# Patient Record
Sex: Female | Born: 1950 | Race: White | Hispanic: No | Marital: Married | Smoking: Never smoker
Health system: Southern US, Community
[De-identification: ages and names within clinical notes are randomized; demographics above are authoritative.]

## PROBLEM LIST (undated history)

## (undated) DIAGNOSIS — M5126 Other intervertebral disc displacement, lumbar region: Secondary | ICD-10-CM

## (undated) DIAGNOSIS — E669 Obesity, unspecified: Secondary | ICD-10-CM

## (undated) HISTORY — PX: DILATION AND CURETTAGE OF UTERUS: SHX78

## (undated) HISTORY — PX: KNEE SURGERY: SHX244

## (undated) HISTORY — PX: TUBAL LIGATION: SHX77

---

## 2019-11-11 ENCOUNTER — Inpatient Hospital Stay (HOSPITAL_COMMUNITY): Payer: BC Managed Care – PPO

## 2019-11-11 ENCOUNTER — Emergency Department (HOSPITAL_COMMUNITY): Payer: BC Managed Care – PPO

## 2019-11-11 ENCOUNTER — Encounter (HOSPITAL_COMMUNITY): Payer: Self-pay | Admitting: Emergency Medicine

## 2019-11-11 ENCOUNTER — Encounter (HOSPITAL_COMMUNITY): Payer: BC Managed Care – PPO

## 2019-11-11 ENCOUNTER — Other Ambulatory Visit: Payer: Self-pay

## 2019-11-11 ENCOUNTER — Observation Stay (HOSPITAL_COMMUNITY)
Admission: EM | Admit: 2019-11-11 | Discharge: 2019-11-12 | DRG: 065 | Disposition: A | Discharge status: Home / Self Care | Payer: BC Managed Care – PPO | Attending: Internal Medicine | Admitting: Internal Medicine

## 2019-11-11 DIAGNOSIS — I63411 Cerebral infarction due to embolism of right middle cerebral artery: Principal | ICD-10-CM | POA: Diagnosis present

## 2019-11-11 DIAGNOSIS — G8194 Hemiplegia, unspecified affecting left nondominant side: Secondary | ICD-10-CM | POA: Diagnosis not present

## 2019-11-11 DIAGNOSIS — R531 Weakness: Secondary | ICD-10-CM | POA: Diagnosis present

## 2019-11-11 DIAGNOSIS — E669 Obesity, unspecified: Secondary | ICD-10-CM | POA: Diagnosis not present

## 2019-11-11 DIAGNOSIS — I6389 Other cerebral infarction: Secondary | ICD-10-CM

## 2019-11-11 DIAGNOSIS — Z8051 Family history of malignant neoplasm of kidney: Secondary | ICD-10-CM | POA: Diagnosis not present

## 2019-11-11 DIAGNOSIS — I10 Essential (primary) hypertension: Secondary | ICD-10-CM | POA: Diagnosis not present

## 2019-11-11 DIAGNOSIS — Z91040 Latex allergy status: Secondary | ICD-10-CM | POA: Diagnosis not present

## 2019-11-11 DIAGNOSIS — R29701 NIHSS score 1: Secondary | ICD-10-CM | POA: Diagnosis present

## 2019-11-11 DIAGNOSIS — Z803 Family history of malignant neoplasm of breast: Secondary | ICD-10-CM | POA: Diagnosis not present

## 2019-11-11 DIAGNOSIS — Z6835 Body mass index (BMI) 35.0-35.9, adult: Secondary | ICD-10-CM | POA: Diagnosis not present

## 2019-11-11 DIAGNOSIS — I672 Cerebral atherosclerosis: Secondary | ICD-10-CM | POA: Diagnosis not present

## 2019-11-11 DIAGNOSIS — E785 Hyperlipidemia, unspecified: Secondary | ICD-10-CM | POA: Diagnosis present

## 2019-11-11 DIAGNOSIS — R2990 Unspecified symptoms and signs involving the nervous system: Secondary | ICD-10-CM | POA: Diagnosis not present

## 2019-11-11 DIAGNOSIS — R739 Hyperglycemia, unspecified: Secondary | ICD-10-CM | POA: Diagnosis present

## 2019-11-11 DIAGNOSIS — Z20822 Contact with and (suspected) exposure to covid-19: Secondary | ICD-10-CM | POA: Diagnosis present

## 2019-11-11 DIAGNOSIS — Z880 Allergy status to penicillin: Secondary | ICD-10-CM | POA: Diagnosis not present

## 2019-11-11 DIAGNOSIS — I639 Cerebral infarction, unspecified: Secondary | ICD-10-CM | POA: Diagnosis not present

## 2019-11-11 HISTORY — DX: Cerebral infarction, unspecified: I63.9

## 2019-11-11 HISTORY — DX: Obesity, unspecified: E66.9

## 2019-11-11 HISTORY — DX: Other intervertebral disc displacement, lumbar region: M51.26

## 2019-11-11 LAB — URINALYSIS, ROUTINE W REFLEX MICROSCOPIC
Bilirubin Urine: NEGATIVE
Glucose, UA: NEGATIVE mg/dL
Hgb urine dipstick: NEGATIVE
Ketones, ur: NEGATIVE mg/dL
Nitrite: NEGATIVE
Protein, ur: NEGATIVE mg/dL
Specific Gravity, Urine: 1.003 — ABNORMAL LOW (ref 1.005–1.030)
pH: 6 (ref 5.0–8.0)

## 2019-11-11 LAB — COMPREHENSIVE METABOLIC PANEL
ALT: 20 U/L (ref 0–44)
AST: 19 U/L (ref 15–41)
Albumin: 4.2 g/dL (ref 3.5–5.0)
Alkaline Phosphatase: 97 U/L (ref 38–126)
Anion gap: 12 (ref 5–15)
BUN: 12 mg/dL (ref 8–23)
CO2: 24 mmol/L (ref 22–32)
Calcium: 9.6 mg/dL (ref 8.9–10.3)
Chloride: 103 mmol/L (ref 98–111)
Creatinine, Ser: 0.85 mg/dL (ref 0.44–1.00)
GFR calc Af Amer: >60 mL/min (ref >60)
GFR calc non Af Amer: >60 mL/min (ref >60)
Glucose, Bld: 142 mg/dL — ABNORMAL HIGH (ref 70–99)
Potassium: 4 mmol/L (ref 3.5–5.1)
Sodium: 139 mmol/L (ref 135–145)
Total Bilirubin: 0.2 mg/dL — ABNORMAL LOW (ref 0.3–1.2)
Total Protein: 6.5 g/dL (ref 6.5–8.1)

## 2019-11-11 LAB — DIFFERENTIAL
Abs Immature Granulocytes: 0.04 10*3/uL (ref 0.00–0.07)
Basophils Absolute: 0.1 10*3/uL (ref 0.0–0.1)
Basophils Relative: 1 %
Eosinophils Absolute: 0.1 10*3/uL (ref 0.0–0.5)
Eosinophils Relative: 1 %
Immature Granulocytes: 0 %
Lymphocytes Relative: 17 %
Lymphs Abs: 1.6 10*3/uL (ref 0.7–4.0)
Monocytes Absolute: 0.5 10*3/uL (ref 0.1–1.0)
Monocytes Relative: 5 %
Neutro Abs: 7.1 10*3/uL (ref 1.7–7.7)
Neutrophils Relative %: 76 %

## 2019-11-11 LAB — RAPID URINE DRUG SCREEN, HOSP PERFORMED
Amphetamines: NOT DETECTED
Barbiturates: NOT DETECTED
Benzodiazepines: NOT DETECTED
Cocaine: NOT DETECTED
Opiates: NOT DETECTED
Tetrahydrocannabinol: NOT DETECTED

## 2019-11-11 LAB — CBC
HCT: 47.2 % — ABNORMAL HIGH (ref 36.0–46.0)
Hemoglobin: 15.7 g/dL — ABNORMAL HIGH (ref 12.0–15.0)
MCH: 30.3 pg (ref 26.0–34.0)
MCHC: 33.3 g/dL (ref 30.0–36.0)
MCV: 91.1 fL (ref 80.0–100.0)
Platelets: 280 10*3/uL (ref 150–400)
RBC: 5.18 MIL/uL — ABNORMAL HIGH (ref 3.87–5.11)
RDW: 12.8 % (ref 11.5–15.5)
WBC: 9.5 10*3/uL (ref 4.0–10.5)
nRBC: 0 % (ref 0.0–0.2)

## 2019-11-11 LAB — PROTIME-INR
INR: 0.9 (ref 0.8–1.2)
Prothrombin Time: 12.1 seconds (ref 11.4–15.2)

## 2019-11-11 LAB — I-STAT CHEM 8, ED
BUN: 13 mg/dL (ref 8–23)
Calcium, Ion: 1.15 mmol/L (ref 1.15–1.40)
Chloride: 105 mmol/L (ref 98–111)
Creatinine, Ser: 0.7 mg/dL (ref 0.44–1.00)
Glucose, Bld: 145 mg/dL — ABNORMAL HIGH (ref 70–99)
HCT: 46 % (ref 36.0–46.0)
Hemoglobin: 15.6 g/dL — ABNORMAL HIGH (ref 12.0–15.0)
Potassium: 4 mmol/L (ref 3.5–5.1)
Sodium: 141 mmol/L (ref 135–145)
TCO2: 27 mmol/L (ref 22–32)

## 2019-11-11 LAB — ECHOCARDIOGRAM COMPLETE
Height: 64 in
Weight: 3328 oz

## 2019-11-11 LAB — HEMOGLOBIN A1C
Hgb A1c MFr Bld: 5.4 % (ref 4.8–5.6)
Mean Plasma Glucose: 108.28 mg/dL

## 2019-11-11 LAB — SARS CORONAVIRUS 2 (TAT 6-24 HRS): SARS Coronavirus 2: NEGATIVE

## 2019-11-11 LAB — APTT: aPTT: 29 seconds (ref 24–36)

## 2019-11-11 LAB — ETHANOL: Alcohol, Ethyl (B): <10 mg/dL (ref <10)

## 2019-11-11 LAB — HIV ANTIBODY (ROUTINE TESTING W REFLEX): HIV Screen 4th Generation wRfx: NONREACTIVE

## 2019-11-11 MED ORDER — CLOPIDOGREL BISULFATE 75 MG PO TABS
75.0000 mg | ORAL_TABLET | Freq: Every day | ORAL | Status: DC
  Administered 2019-11-12: 75 mg via ORAL
  Filled 2019-11-11 (×2): qty 1

## 2019-11-11 MED ORDER — ACETAMINOPHEN 325 MG PO TABS: 650.0000 mg | ORAL_TABLET | ORAL | Status: DC | PRN

## 2019-11-11 MED ORDER — ASPIRIN 325 MG PO TABS: 325.0000 mg | ORAL_TABLET | Freq: Every day | ORAL | Status: DC

## 2019-11-11 MED ORDER — CLOPIDOGREL BISULFATE 300 MG PO TABS
300.0000 mg | ORAL_TABLET | Freq: Once | ORAL | Status: AC
  Administered 2019-11-11: 300 mg via ORAL
  Filled 2019-11-11: qty 1

## 2019-11-11 MED ORDER — ENOXAPARIN SODIUM 40 MG/0.4ML ~~LOC~~ SOLN
40.0000 mg | SUBCUTANEOUS | Status: DC
  Administered 2019-11-11: 40 mg via SUBCUTANEOUS
  Filled 2019-11-11 (×2): qty 0.4

## 2019-11-11 MED ORDER — SODIUM CHLORIDE 0.9 % IV SOLN: INTRAVENOUS | Status: DC

## 2019-11-11 MED ORDER — ASPIRIN 300 MG RE SUPP: 300.0000 mg | Freq: Every day | RECTAL | Status: DC

## 2019-11-11 MED ORDER — ACETAMINOPHEN 160 MG/5ML PO SOLN: 650.0000 mg | ORAL | Status: DC | PRN

## 2019-11-11 MED ORDER — ACETAMINOPHEN 650 MG RE SUPP: 650.0000 mg | RECTAL | Status: DC | PRN

## 2019-11-11 MED ORDER — ATORVASTATIN CALCIUM 80 MG PO TABS
80.0000 mg | ORAL_TABLET | Freq: Every day | ORAL | Status: DC
  Filled 2019-11-11: qty 1

## 2019-11-11 MED ORDER — STROKE: EARLY STAGES OF RECOVERY BOOK
Freq: Once | Status: AC
  Filled 2019-11-11: qty 1

## 2019-11-11 MED ORDER — ASPIRIN EC 81 MG PO TBEC
81.0000 mg | DELAYED_RELEASE_TABLET | Freq: Every day | ORAL | Status: DC
  Administered 2019-11-11 – 2019-11-12 (×2): 81 mg via ORAL
  Filled 2019-11-11 (×2): qty 1

## 2019-11-11 MED ORDER — SENNOSIDES-DOCUSATE SODIUM 8.6-50 MG PO TABS: 1.0000 | ORAL_TABLET | Freq: Every evening | ORAL | Status: DC | PRN

## 2019-11-11 MED ORDER — IOHEXOL 350 MG/ML SOLN
75.0000 mL | Freq: Once | INTRAVENOUS | Status: AC | PRN
  Administered 2019-11-11: 75 mL via INTRAVENOUS

## 2019-11-11 NOTE — Progress Notes (Signed)
Patient admitted to [facility] 18 with left side weakness due to acute CVA . Pt alert oriented x 4 VS WNL. She is in locked at lower level. Bedside table call light and telephone at bedside. Will continue to monitor patient.

## 2019-11-11 NOTE — Progress Notes (Signed)
Pt transferred to Green zone. Pt is AOx4, pt talkative and reports no needs at this time. Call light placed within reach.

## 2019-11-11 NOTE — Progress Notes (Signed)
Lower extremity venous has been completed.   Preliminary results in CV Proc.   Blanch Media 11/11/2019 1:31 PM

## 2019-11-11 NOTE — Progress Notes (Addendum)
Pt vts are stable, pt talking on phone, pt reporting no needs at this time. Pt taken to bathroom, able to walk across hall to restroom. Pt walked back to room. Call light placed within reach.

## 2019-11-11 NOTE — ED Triage Notes (Signed)
At approx 0230 pt awoke feeling heavy and "weird" in her left arm and leg. Pt able to walk unassisted but has minor limp which is abnormal. NIH 0 at this time. Pt reports symptoms improving since waking up. LSN 2330.

## 2019-11-11 NOTE — Plan of Care (Signed)
  Problem: Education: Goal: Knowledge of disease or condition will improve Outcome: Progressing Goal: Knowledge of patient specific risk factors addressed and post discharge goals established will improve Outcome: Progressing   Problem: Coping: Goal: Will verbalize positive feelings about self Outcome: Progressing Goal: Will identify appropriate support needs Outcome: Progressing   Problem: Self-Care: Goal: Ability to participate in self-care as condition permits will improve Outcome: Progressing

## 2019-11-11 NOTE — Consult Note (Signed)
Neurology Consultation  Reason for Consult: Code stroke-left-sided weakness Referring Physician: Dr. Eudelia Bunch  CC: Left-sided numbness and weakness  History is obtained from: Patient  HPI: Nancy Travis is a 69 y.o. female with no past medical history, last seen normal at 11:30 PM on 11/10/2019, brought in for evaluation of left-sided heaviness.  She says that she went to bed normal at 11:30 PM on 11/10/2019.  She woke up around 2:30 AM feeling somewhat heavy on the left side.  She describes the feeling as her left arm and leg not obeying her commands.  She tried to get up but felt that she could not walk and her body cannot function on that r left side as well it did on the right side.  She lives in [place], has a hospital near the house, but knows that [facility] is a big stroke center.  She asked her husband to drive her to [facility].  She was evaluated here in the emergency room, NIH stroke scale was 0. Neurological consultation was obtained for ongoing subtle left-sided weakness of sudden onset.  LKW: 11:30 PM on 11/09/2018 tpa given?: no, mild symptoms, resolving symptoms,, outside the window Premorbid modified Rankin scale (mRS): 0  ROS: Review of systems performed and negative except noted in the HPI  History reviewed. No pertinent past medical history. Past medical history: None   No family history on file. No family history of stroke  Social History:   reports that she has never smoked. She has never used smokeless tobacco. She reports that she does not drink alcohol or use drugs. Never smoker.  No illicit drugs. 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  Does not see a doctor regularly.  Medications No current facility-administered medications for this encounter. No current outpatient medications on file.  Exam: Current vital signs: BP (!) 155/103   Pulse 90   Temp 98.6 F (37 C) (Oral)   Resp 16   Ht 5\' 4"  (1.626 m)   Wt 94.3 kg    SpO2 97%   BMI 35.70 kg/m  Vital signs in last 24 hours: Temp:  [98.6 F (37 C)] 98.6 F (37 C) (01/15 0414) Pulse Rate:  [90] 90 (01/15 0346) Resp:  [16] 16 (01/15 0346) BP: (155)/(103) 155/103 (01/15 0346) SpO2:  [97 %] 97 % (01/15 0346) Weight:  [94.3 kg] 94.3 kg (01/15 0344) GENERAL: Awake, alert in NAD HEENT: - Normocephalic and atraumatic, dry mm, no LN++, no Thyromegally LUNGS - Clear to auscultation bilaterally with no wheezes CV - S1S2 RRR, no m/r/g, equal pulses bilaterally. ABDOMEN - Soft, nontender, nondistended with normoactive BS Ext: warm, well perfused, intact peripheral pulses, no edema  NEURO:  Mental Status: AA&Ox3  Language: speech is nondysarthric.  Naming, repetition, fluency, and comprehension intact. Cranial Nerves: PERRL EOMI, visual fields full, no facial asymmetry, facial sensation intact, hearing intact, tongue/uvula/soft palate midline, normal sternocleidomastoid and trapezius muscle strength. No evidence of tongue atrophy or fibrillations Motor: Left upper 4+/5 and left lower extremity are 4/5 in comparison to the right side which is 5/5 upper and lower extremity.  There is only vertical drift on the left leg and no vertical drift on the left upper extremity Tone: is normal and bulk is normal normal Sensation- Intact to light touch bilaterally, no extinction Coordination: Mild dysmetria on left upper and lower extremity-not disproportionate to the weakness.  No dysmetria on the right Gait- deferred  NIHSS-1 left leg drift   Labs I  have reviewed labs in epic and the results pertinent to this consultation are:   CBC    Component Value Date/Time   HGB 15.6 (H) 11/11/2019 0437   HCT 46.0 11/11/2019 0437    CMP     Component Value Date/Time   NA 141 11/11/2019 0437   K 4.0 11/11/2019 0437   CL 105 11/11/2019 0437   GLUCOSE 145 (H) 11/11/2019 0437   BUN 13 11/11/2019 0437   CREATININE 0.70 11/11/2019 0437   Imaging I have reviewed the  images obtained:  CT-scan of the brain-no acute changes   Assessment: 69 year old with no past medical history with sudden onset of left-sided numbness and weakness, which is improving since she noted it at 2:30 AM. Last known normal 11:30 PM last night when she went to bed. No TPA due to being outside the window at the time of neurological evaluation as well as minor symptoms and improving symptoms. Exam not suggestive of LVO.  Emergent vessel imaging not performed. She does however have subtle weakness of the left upper and lower extremity and I suspect has had a small vessel stroke. She would benefit from admission for risk factor work-up.   Impression:  Evaluate for ischemic stroke of small vessel etiology.  Recommendations: MRI brain without contrast MRA head without contrast Carotid Dopplers Admit to hospitalist Frequent neurochecks Telemetry 2D echo A1c Lipid panel Aspirin 325 Atorvastatin 80 PT OT Speech therapy N.p.o. until cleared by bedside swallow or formal swallow evaluation  Stroke team to continue to follow with you  -- Amie Portland, MD [facility] Pager: [phone] If 7pm to 7am, please call on call as listed on AMION.

## 2019-11-11 NOTE — Congregational Nurse Program (Signed)
Responded to Code Stroke called at 0429 on pt already in ED. Pt brought to ED by husband d/t L sided weakness upon waking from sleep. Pt [id number]. Code stroke was initiated upon MD assessment in ED.  On my assessment on pt in CT, NIH-1 for LLE drift. CT head negative for acute changes. Pt out of window for TPA. LVO negative.  Plan MRI.

## 2019-11-11 NOTE — Evaluation (Addendum)
Physical Therapy Evaluation Patient Details Name: Nancy Travis MRN: [id number] DOB: January 24, 1951 Today's Date: 11/11/2019   History of Present Illness  Nancy Travis is a 69 y.o. female with no past medical history, last seen normal at 11:30 PM on 11/10/2019, brought in for evaluation of left-sided heaviness. MRI showing cluster of small acute infarcts in a right MCA branch  Clinical Impression  Pt admitted with above. Pt presents with left sided weakness and static/dynamic balance impairments. Ambulating 100 feet with handheld assist, needs external support for stability. Overall, pt with good awareness of deficits and symptoms of a stroke. Prior to admission, pt is independent and lives with her husband in an apartment with an Engineer, structural. Recommended use of walker for all mobility. Will benefit from intensive OPPT to promote neuro recovery and maximize functional independence.    Follow Up Recommendations Outpatient PT    Equipment Recommendations  Rolling walker with 5" wheels    Recommendations for Other Services       Precautions / Restrictions Precautions Precautions: Fall Restrictions Weight Bearing Restrictions: No      Mobility  Bed Mobility Overal bed mobility: Modified Independent                Transfers Overall transfer level: Needs assistance Equipment used: None Transfers: Sit to/from Stand Sit to Stand: Supervision            Ambulation/Gait Ambulation/Gait assistance: Min assist Gait Distance (Feet): 100 Feet Assistive device: 1 person hand held assist Gait Pattern/deviations: Step-through pattern;Decreased stance time - left;Decreased dorsiflexion - left;Decreased weight shift to left Gait velocity: decreased Gait velocity interpretation: <1.8 ft/sec, indicate of risk for recurrent falls General Gait Details: MinA for static/dynamic stability. Pt with decreased LLE weightbearing, with heavy reliance through RLE and moderate reliance  through handheld assist.  Stairs            Wheelchair Mobility    Modified Rankin (Stroke Patients Only) Modified Rankin (Stroke Patients Only) Pre-Morbid Rankin Score: No symptoms Modified Rankin: Moderately severe disability     Balance Overall balance assessment: Needs assistance Sitting-balance support: Feet supported Sitting balance-Leahy Scale: Normal     Standing balance support: No upper extremity supported;During functional activity Standing balance-Leahy Scale: Good           Rhomberg - Eyes Opened: 10(increased lateral sway)                   Pertinent Vitals/Pain Pain Assessment: Faces Faces Pain Scale: No hurt    Home Living Family/patient expects to be discharged to:: Private residence Living Arrangements: Spouse/significant other Available Help at Discharge: Family Type of Home: Apartment Home Access: Elevator     Home Layout: One level Home Equipment: Environmental consultant - 2 wheels;Cane - single point      Prior Function Level of Independence: Independent               Hand Dominance        Extremity/Trunk Assessment   Upper Extremity Assessment Upper Extremity Assessment: RUE deficits/detail;LUE deficits/detail RUE Deficits / Details: Strength 5/5 LUE Deficits / Details: Grossly 4/5, weak hand grip    Lower Extremity Assessment Lower Extremity Assessment: RLE deficits/detail;LLE deficits/detail RLE Deficits / Details: Strength 5/5 LLE Deficits / Details: Hip flexion 3+/5, knee extension 4-/5, ankle dorsiflexion 5/5    Cervical / Trunk Assessment Cervical / Trunk Assessment: Normal  Communication   Communication: No difficulties  Cognition Arousal/Alertness: Awake/alert Behavior During Therapy: WFL for tasks assessed/performed Overall Cognitive  Status: Within Functional Limits for tasks assessed                                 General Comments: Very good awareness of deficits and stroke etiology       General Comments      Exercises     Assessment/Plan    PT Assessment Patient needs continued PT services  PT Problem List Decreased strength;Decreased activity tolerance;Decreased balance;Decreased mobility       PT Treatment Interventions DME instruction;Gait training;Stair training;Functional mobility training;Therapeutic activities;Therapeutic exercise;Balance training;Patient/family education    PT Goals (Current goals can be found in the Care Plan section)  Acute Rehab PT Goals Patient Stated Goal: "get back to normal." PT Goal Formulation: With patient Time For Goal Achievement: 11/25/19 Potential to Achieve Goals: Good    Frequency Min 4X/week   Barriers to discharge        Co-evaluation               AM-PAC PT "6 Clicks" Mobility  Outcome Measure Help needed turning from your back to your side while in a flat bed without using bedrails?: None Help needed moving from lying on your back to sitting on the side of a flat bed without using bedrails?: None Help needed moving to and from a bed to a chair (including a wheelchair)?: A Little Help needed standing up from a chair using your arms (e.g., wheelchair or bedside chair)?: None Help needed to walk in hospital room?: A Little Help needed climbing 3-5 steps with a railing? : A Little 6 Click Score: 21    End of Session Equipment Utilized During Treatment: Gait belt Activity Tolerance: Patient tolerated treatment well Patient left: in bed;with call bell/phone within reach;with bed alarm set;with family/visitor present Nurse Communication: Mobility status PT Visit Diagnosis: Unsteadiness on feet (R26.81);Other abnormalities of gait and mobility (R26.89);Other symptoms and signs involving the nervous system (R29.898)    Time: 1610-9604 PT Time Calculation (min) (ACUTE ONLY): 21 min   Charges:   PT Evaluation $PT Eval Moderate Complexity: 1 Mod          Ellamae Sia, [place], DPT Acute Rehabilitation  Services Pager [phone] Office [phone]   Willy Eddy 11/11/2019, 5:27 PM

## 2019-11-11 NOTE — H&P (Signed)
History and Physical    Nancy Latinlizabeth P Risser BMW:[id number] DOB: 1951-02-06 DOA: 11/11/2019  PCP: Patient, No Pcp Per - last saw a doctor 3-4 years ago Consultants:  None Patient coming from:  Home - lives with husband; NOK: Dondra SpryHusband, Edward Winkowski, [phone]  Chief Complaint: left-sided weakness  HPI: Nancy Travis is a [age] y.o. female with no significant medical history presenting with left-sided weakness. She watched the late news last night and went to bed about 1130 pm and went to sleep.  Sometime thereafter, she woke up and was unable to turn over.  Her body would just not work.  She couldn't get her left arm to work at all, felt like a dead weight.  She managed to get up out of bed and placed her weight on her left leg and it wasn't able to hold her up.  Her husband woke up and they were concerned that this was a stroke.  She is still able to lift the arm but it will not function - although it is much better than it was.  She felt a slight tingling in her left lower face en route but that quickly resolved.  Her leg is still weak.  No dysphagia or dysarthria.     ED Course:  Carryover, per Dr. Knute NeuKarrakandy:  69 year old female presents with sudden onset of left-sided weakness when she woke up from the sleep early this morning around 2 AM. Neurology has been consulted felt that patient was not a candidate for TPA. Covid test is pending.  Review of Systems: As per HPI; otherwise review of systems reviewed and negative.   Ambulatory Status:  Ambulates without assistance  Past Medical History:  Diagnosis Date  . CVA (cerebral vascular accident) (HCC) 11/11/2019  . Lumbar herniated disc   . Obesity (BMI 35.0-39.9 without comorbidity)     Past Surgical History:  Procedure Laterality Date  . DILATION AND CURETTAGE OF UTERUS    . KNEE SURGERY    . TUBAL LIGATION      Social History   Socioeconomic History  . Marital status: Married    Spouse name: Not on file  . Number of  children: Not on file  . Years of education: Not on file  . Highest education level: Not on file  Occupational History  . 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 Smoking status: Never Smoker  . Smokeless tobacco: Never Used  Substance and Sexual Activity  . Alcohol use: Never  . Drug use: Never  . Sexual activity: Not Currently  Other Topics Concern  . Not on file  Social History Narrative  . Not on file   Social Determinants of Health   Financial Resource Strain:   . Difficulty of Paying Living Expenses: Not on file  Food Insecurity:   . Worried About Programme researcher, broadcasting/film/videounning Out of Food in the Last Year: Not on file  . Ran Out of Food in the Last Year: Not on file  Transportation Needs:   . Lack of Transportation (Medical): Not on file  . Lack of Transportation (Non-Medical): Not on file  Physical Activity:   . Days of Exercise per Week: Not on file  . Minutes of Exercise per Session: Not on file  Stress:   . Feeling of Stress : Not on file  Social Connections:   . Frequency of Communication with Friends and Family: Not on file  . Frequency of Social Gatherings with Friends and Family: Not on file  . Attends  Religious Services: Not on file  . Active Member of Clubs or Organizations: Not on file  . Attends Archivist Meetings: Not on file  . Marital Status: Not on file  Intimate Partner Violence:   . Fear of Current or Ex-Partner: Not on file  . Emotionally Abused: Not on file  . Physically Abused: Not on file  . Sexually Abused: Not on file    Allergies  Allergen Reactions  . Penicillins Anaphylaxis and Hives    Did it involve swelling of the face/tongue/throat, SOB, or low BP? yes Did it involve sudden or severe rash/hives, skin peeling, or any reaction on the inside of your mouth or nose? Yes Did you need to seek medical attention at a hospital or doctor's office? yes When did it last happen?in her 68s If all above answers are "NO", may  proceed with cephalosporin use.   . Levaquin [Levofloxacin] Nausea Only  . Sulfa Antibiotics Nausea Only  . Latex Rash    Family History  Problem Relation Age of Onset  . Diverticulitis Mother   . Breast cancer Mother   . Dementia Father   . Kidney cancer Brother   . Stroke Neg Hx     Prior to Admission medications   Medication Sig Start Date End Date Taking? Authorizing Provider  acetaminophen (TYLENOL) 500 MG tablet Take 500 mg by mouth every 6 (six) hours as needed.   Yes [provider]  naproxen sodium (ALEVE) 220 MG tablet Take 440 mg by mouth daily as needed (pain).   Yes [provider]    Physical Exam: Vitals:   11/11/19 1200 11/11/19 1215 11/11/19 1230 11/11/19 1314  BP: (!) 152/98 (!) 126/96 (!) 146/86 (!) 152/96  Pulse: 92 81 84 78  Resp: 20 15 16 18   Temp:      TempSrc:    Oral  SpO2: 95% 95% 95% 98%  Weight:      Height:         . General:  Appears calm and comfortable and is NAD; extremely conversant . Eyes:  PERRL, EOMI, normal lids, iris . ENT:  grossly normal hearing, lips & tongue, mmm; appropriate dentition . Neck:  no LAD, masses or thyromegaly; no carotid bruits . Cardiovascular:  RRR, no m/r/g. No LE edema.  Marland Kitchen Respiratory:   CTA bilaterally with no wheezes/rales/rhonchi.  Normal respiratory effort. . Abdomen:  soft, NT, ND, NABS . Skin:  B LE stasis dermatitis in square patches on the anterior lower legs . Musculoskeletal:  3-4/5 left-sided strength with arm > leg . Psychiatric:  grossly normal mood and affect, speech fluent and appropriate, AOx3 . Neurologic:  CN 2-12 grossly intact, moves all extremities in coordinated fashion    Radiological Exams on Admission: CT ANGIO HEAD W OR WO CONTRAST  Result Date: 11/11/2019 CLINICAL DATA:  Stroke, follow-up. Additional history provided: Sudden onset left-sided numbness and weakness, which is improving since 2:30 a.m. EXAM: CT ANGIOGRAPHY HEAD AND NECK TECHNIQUE: Multidetector  CT imaging of the head and neck was performed using the standard protocol during bolus administration of intravenous contrast. Multiplanar CT image reconstructions and MIPs were obtained to evaluate the vascular anatomy. Carotid stenosis measurements (when applicable) are obtained utilizing NASCET criteria, using the distal internal carotid diameter as the denominator. CONTRAST:  38mL OMNIPAQUE IOHEXOL 350 MG/ML SOLN COMPARISON:  Noncontrast head CT 11/11/2019 FINDINGS: CTA NECK FINDINGS Aortic arch: Standard aortic branching. No significant atherosclerotic disease within the visualized aortic arch. Right carotid system: CCA  and ICA patent within the neck without stenosis. No significant atherosclerotic disease. Left carotid system: CCA and ICA patent within the neck without stenosis. No significant atherosclerotic disease. Vertebral arteries: Codominant. Patent within the neck without stenosis. Skeleton: Cervical spondylosis. This includes somewhat prominent posterior disc osteophytes at the C4-C5 and C5-C6 levels, contributing to at least mild/moderate spinal canal stenosis. Age-indeterminate although presumed chronic T4 superior endplate compression fracture. T2 vertebral body hemangioma. Other neck: No neck mass or cervical lymphadenopathy. Upper chest: No consolidation within the imaged lung apices. Review of the MIP images confirms the above findings CTA HEAD FINDINGS Anterior circulation: The intracranial internal carotid arteries are patent without stenosis. Minimal calcified plaque within these vessels bilaterally. The M1 right middle cerebral artery is patent without stenosis. No M2 proximal branch occlusion or high-grade proximal stenosis. The high-grade right M2/M3 branch stenosis demonstrated on MRA performed earlier the same day is not well appreciated on the current study. The right anterior cerebral artery is patent without significant proximal stenosis. The M1 left middle cerebral artery is patent  without significant stenosis. No M2 proximal branch occlusion or high-grade proximal stenosis is identified. Atherosclerotic irregularity of a left M3/M4 branche was better appreciated on prior MRA. The left anterior cerebral artery is patent without significant proximal stenosis. No intracranial aneurysm is identified Posterior circulation: The intracranial vertebral arteries are patent without significant stenosis, as is the basilar artery. Small fenestration within the proximal basilar artery redemonstrated. There are sizable bilateral posterior communicating arteries which are patent. The posterior cerebral arteries are patent bilaterally without significant proximal stenosis. Venous sinuses: Within limitations of contrast timing, no convincing thrombus. Anatomic variants: None significant Review of the MIP images confirms the above findings IMPRESSION: CTA neck: 1. The common carotid, internal carotid and vertebral arteries are patent within the neck without significant stenosis. 2. T4 superior endplate compression deformity, age-indeterminate although presumed chronic. Clinical correlation is recommended. CTA head: 1. No intracranial large vessel occlusion. 2. No proximal M2 branch occlusion or high-grade stenosis identified. 3. A high-grade right M2/M3 branch short-segment narrowing was better appreciated on prior MRA. 4. Atherosclerotic irregularity of a left M3/M4 MCA branch was also better appreciated on prior MRA. Electronically Signed   By: Jackey Loge DO   On: 11/11/2019 10:30   CT ANGIO NECK W OR WO CONTRAST  Result Date: 11/11/2019 CLINICAL DATA:  Stroke, follow-up. Additional history provided: Sudden onset left-sided numbness and weakness, which is improving since 2:30 a.m. EXAM: CT ANGIOGRAPHY HEAD AND NECK TECHNIQUE: Multidetector CT imaging of the head and neck was performed using the standard protocol during bolus administration of intravenous contrast. Multiplanar CT image reconstructions  and MIPs were obtained to evaluate the vascular anatomy. Carotid stenosis measurements (when applicable) are obtained utilizing NASCET criteria, using the distal internal carotid diameter as the denominator. CONTRAST:  67mL OMNIPAQUE IOHEXOL 350 MG/ML SOLN COMPARISON:  Noncontrast head CT 11/11/2019 FINDINGS: CTA NECK FINDINGS Aortic arch: Standard aortic branching. No significant atherosclerotic disease within the visualized aortic arch. Right carotid system: CCA and ICA patent within the neck without stenosis. No significant atherosclerotic disease. Left carotid system: CCA and ICA patent within the neck without stenosis. No significant atherosclerotic disease. Vertebral arteries: Codominant. Patent within the neck without stenosis. Skeleton: Cervical spondylosis. This includes somewhat prominent posterior disc osteophytes at the C4-C5 and C5-C6 levels, contributing to at least mild/moderate spinal canal stenosis. Age-indeterminate although presumed chronic T4 superior endplate compression fracture. T2 vertebral body hemangioma. Other neck: No neck mass or cervical lymphadenopathy.  Upper chest: No consolidation within the imaged lung apices. Review of the MIP images confirms the above findings CTA HEAD FINDINGS Anterior circulation: The intracranial internal carotid arteries are patent without stenosis. Minimal calcified plaque within these vessels bilaterally. The M1 right middle cerebral artery is patent without stenosis. No M2 proximal branch occlusion or high-grade proximal stenosis. The high-grade right M2/M3 branch stenosis demonstrated on MRA performed earlier the same day is not well appreciated on the current study. The right anterior cerebral artery is patent without significant proximal stenosis. The M1 left middle cerebral artery is patent without significant stenosis. No M2 proximal branch occlusion or high-grade proximal stenosis is identified. Atherosclerotic irregularity of a left M3/M4 branche was  better appreciated on prior MRA. The left anterior cerebral artery is patent without significant proximal stenosis. No intracranial aneurysm is identified Posterior circulation: The intracranial vertebral arteries are patent without significant stenosis, as is the basilar artery. Small fenestration within the proximal basilar artery redemonstrated. There are sizable bilateral posterior communicating arteries which are patent. The posterior cerebral arteries are patent bilaterally without significant proximal stenosis. Venous sinuses: Within limitations of contrast timing, no convincing thrombus. Anatomic variants: None significant Review of the MIP images confirms the above findings IMPRESSION: CTA neck: 1. The common carotid, internal carotid and vertebral arteries are patent within the neck without significant stenosis. 2. T4 superior endplate compression deformity, age-indeterminate although presumed chronic. Clinical correlation is recommended. CTA head: 1. No intracranial large vessel occlusion. 2. No proximal M2 branch occlusion or high-grade stenosis identified. 3. A high-grade right M2/M3 branch short-segment narrowing was better appreciated on prior MRA. 4. Atherosclerotic irregularity of a left M3/M4 MCA branch was also better appreciated on prior MRA. Electronically Signed   By: Jackey LogeKyle  Golden DO   On: 11/11/2019 10:30   MR ANGIO HEAD WO CONTRAST  Result Date: 11/11/2019 CLINICAL DATA:  Left-sided heaviness beginning overnight EXAM: MRI HEAD WITHOUT CONTRAST MRA HEAD WITHOUT CONTRAST TECHNIQUE: Multiplanar, multiecho pulse sequences of the brain and surrounding structures were obtained without intravenous contrast. Angiographic images of the head were obtained using MRA technique without contrast. COMPARISON:  None. FINDINGS: MRI HEAD FINDINGS Brain: Cluster of small acute infarcts along the white matter and cortex of the right high frontal and parietal lobes, MCA branch distribution. Few remote small  vessel ischemic type injuries in the cerebral white matter. No evident prior cortical infarct. Mild cerebral volume loss. No hemorrhage, hydrocephalus, or masslike finding. Vascular: Arterial findings below. Skull and upper cervical spine: Negative for marrow lesion. Hyperostosis interna. Sinuses/Orbits: Partial right mastoid opacification with negative nasopharynx. Mild mucosal thickening in the left maxillary sinus. Bilateral cataract resection MRA HEAD FINDINGS Small vertebral and basilar arteries in the setting of large posterior communicating arteries. There is a proximal basilar fenestration. No branch occlusion, flow limiting stenosis, or aneurysm seen in the posterior circulation. Irregularity of the bilateral carotid siphon attributed to artifact and probable atherosclerosis. No branch occlusion, flow limiting stenosis, or beading seen on the right. There is a high-grade right M2/3 branch narrowing with short segment flow gap. Atheromatous type irregularity of a left M3/4 branch is also noted. IMPRESSION: 1. 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 2. Intracranial atherosclerosis with high-grade right M2/3 branch stenosis. Electronically Signed   By: Marnee SpringJonathon  Watts M.D.   On: 11/11/2019 06:59   MR BRAIN WO CONTRAST  Result Date: 11/11/2019 CLINICAL DATA:  Left-sided heaviness beginning overnight EXAM: MRI HEAD  WITHOUT CONTRAST MRA HEAD WITHOUT CONTRAST TECHNIQUE: Multiplanar, multiecho pulse sequences of the brain and surrounding structures were obtained without intravenous contrast. Angiographic images of the head were obtained using MRA technique without contrast. COMPARISON:  None. FINDINGS: MRI HEAD FINDINGS Brain: Cluster of small acute infarcts along the white matter and cortex of the right high frontal and parietal lobes, MCA branch distribution. Few remote small vessel ischemic type injuries in the cerebral white matter.  No evident prior cortical infarct. Mild cerebral volume loss. No hemorrhage, hydrocephalus, or masslike finding. Vascular: Arterial findings below. Skull and upper cervical spine: Negative for marrow lesion. Hyperostosis interna. Sinuses/Orbits: Partial right mastoid opacification with negative nasopharynx. Mild mucosal thickening in the left maxillary sinus. Bilateral cataract resection MRA HEAD FINDINGS Small vertebral and basilar arteries in the setting of large posterior communicating arteries. There is a proximal basilar fenestration. No branch occlusion, flow limiting stenosis, or aneurysm seen in the posterior circulation. Irregularity of the bilateral carotid siphon attributed to artifact and probable atherosclerosis. No branch occlusion, flow limiting stenosis, or beading seen on the right. There is a high-grade right M2/3 branch narrowing with short segment flow gap. Atheromatous type irregularity of a left M3/4 branch is also noted. IMPRESSION: 1. 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 2. Intracranial atherosclerosis with high-grade right M2/3 branch stenosis. Electronically Signed   By: Marnee Spring M.D.   On: 11/11/2019 06:59   CT HEAD CODE STROKE WO CONTRAST  Result Date: 11/11/2019 CLINICAL DATA:  Code stroke.  Left-sided weakness EXAM: CT HEAD WITHOUT CONTRAST TECHNIQUE: Contiguous axial images were obtained from the base of the skull through the vertex without intravenous contrast. COMPARISON:  None. FINDINGS: Brain: No evidence of acute infarction, hemorrhage, hydrocephalus, extra-axial collection or mass lesion/mass effect. Mild white matter disease. Vascular: No hyperdense vessel or unexpected calcification. Skull: Normal. Negative for fracture or focal lesion. Sinuses/Orbits: No acute finding.  Bilateral cataract resection Other: These results were communicated to Dr. Wilford Corner at 4:38 amon 1/15/2021by text page via  the [facility] messaging system. ASPECTS [facility] Stroke Program Early CT Score) - Ganglionic level infarction (caudate, lentiform nuclei, internal capsule, insula, M1-M3 cortex): 7 - Supraganglionic infarction (M4-M6 cortex): 3 Total score (0-10 with 10 being normal): 10 IMPRESSION: 1. No acute finding. 2. ASPECTS is 10. Electronically Signed   By: Marnee Spring M.D.   On: 11/11/2019 04:39   VAS [place] LOWER EXTREMITY VENOUS (DVT)  Result Date: 11/11/2019  Lower Venous Study Indications: Stroke.  Comparison Study: no prior Performing Technologist: Blanch Media RVS  Examination Guidelines: A complete evaluation includes B-mode imaging, spectral Doppler, color Doppler, and power Doppler as needed of all accessible portions of each vessel. Bilateral testing is considered an integral part of a complete examination. Limited examinations for reoccurring indications may be performed as noted.  +---------+---------------+---------+-----------+----------+--------------+ RIGHT    CompressibilityPhasicitySpontaneityPropertiesThrombus Aging +---------+---------------+---------+-----------+----------+--------------+ CFV      Full           Yes      Yes                                 +---------+---------------+---------+-----------+----------+--------------+ SFJ      Full                                                        +---------+---------------+---------+-----------+----------+--------------+  FV Prox  Full                                                        +---------+---------------+---------+-----------+----------+--------------+ FV Mid   Full                                                        +---------+---------------+---------+-----------+----------+--------------+ FV DistalFull                                                        +---------+---------------+---------+-----------+----------+--------------+ PFV      Full                                                         +---------+---------------+---------+-----------+----------+--------------+ POP      Full           Yes      Yes                                 +---------+---------------+---------+-----------+----------+--------------+ PTV      Full                                                        +---------+---------------+---------+-----------+----------+--------------+ PERO     Full                                                        +---------+---------------+---------+-----------+----------+--------------+   +---------+---------------+---------+-----------+----------+--------------+ LEFT     CompressibilityPhasicitySpontaneityPropertiesThrombus Aging +---------+---------------+---------+-----------+----------+--------------+ CFV      Full           Yes      Yes                                 +---------+---------------+---------+-----------+----------+--------------+ SFJ      Full                                                        +---------+---------------+---------+-----------+----------+--------------+ FV Prox  Full                                                        +---------+---------------+---------+-----------+----------+--------------+  FV Mid   Full                                                        +---------+---------------+---------+-----------+----------+--------------+ FV DistalFull                                                        +---------+---------------+---------+-----------+----------+--------------+ PFV      Full                                                        +---------+---------------+---------+-----------+----------+--------------+ POP      Full           Yes      Yes                                 +---------+---------------+---------+-----------+----------+--------------+ PTV      Full                                                         +---------+---------------+---------+-----------+----------+--------------+ PERO     Full                                                        +---------+---------------+---------+-----------+----------+--------------+     Summary: Right: There is no evidence of deep vein thrombosis in the lower extremity. No cystic structure found in the popliteal fossa. Left: There is no evidence of deep vein thrombosis in the lower extremity. No cystic structure found in the popliteal fossa.  *See table(s) above for measurements and observations. Electronically signed by Sherald Hess MD on 11/11/2019 at 4:03:22 PM.    Final     EKG: Independently reviewed.  NSR with rate 85; no evidence of acute ischemia   Labs on Admission: I have personally reviewed the available labs and imaging studies at the time of the admission.  Pertinent labs:   Glucose 142 WBC 9.5 Hgb 15.7 INR 0.9 ETOH <10 COVID pending   Assessment/Plan Principal Problem:   Acute CVA (cerebrovascular accident) (HCC) Active Problems:   Obesity with body mass index of 30.0-39.9    CVA -Patient presenting with left-sided weakness, acute -Concerning for CVA -Will admit for further CVA evaluation -Telemetry monitoring -MRI/MRA/CTA show a cluster of R MCA infarcts with high-grade stenosis of the R M2/M3 branch -Carotid dopplers -Echo -Risk stratification with FLP, A1c; will also check UDS -ASA daily -Neurology consult -PT/OT/ST/Nutrition Consults  HTN -Allow permissive HTN for now -Treat BP only if >220/120, and then with goal of 15% reduction -She does not take home medications   HLD -Check FLP -Start empiric  Lipitor 80 mg daily   Hyperglycemia -May be stress response -Will follow with fasting AM labs -A1c is normal at 5.4  Obesity -BMI 35.7 -Weight loss should be encouraged -Outpatient PCP/bariatric medicine/bariatric surgery f/u encouraged     Note: This patient has been tested and is negative for the  novel coronavirus COVID-19.   DVT prophylaxis:  Lovenox  Code Status: Full - confirmed with patient Family Communication: None present; I was unable to reach her husband by telephone, but she reported that she was able to communicate with him and did not need me to speak to him Disposition Plan:  Home once clinically improved Consults called: Neurology; PT/OT/ST/Nutrition  Admission status: Admit - It is my clinical opinion that admission to INPATIENT is reasonable and necessary because of the expectation that this patient will require hospital care that crosses at least 2 midnights to treat this condition based on the medical complexity of the problems presented.  Given the aforementioned information, the predictability of an adverse outcome is felt to be significant.     Jonah Blue MD [facility]   How to contact the [facility] Attending or Consulting provider 7A - 7P or covering provider during after hours 7P -7A, for this patient?  1. Check the care team in [facility] and look for a) attending/consulting [facility] provider listed and b) the [facility] team listed 2. Log into [email or web address] and use [place]'s universal password to access. If you do not have the password, please contact the hospital operator. 3. Locate the [facility] provider you are looking for under [facility] and page to a number that you can be directly reached. 4. If you still have difficulty reaching the provider, please page the [facility] (Director on Call) for the Hospitalists listed on amion for assistance.   11/11/2019, 5:47 PM

## 2019-11-11 NOTE — Progress Notes (Signed)
STROKE TEAM PROGRESS NOTE   INTERVAL HISTORY Husband at the bedside, patient stated that this morning she woke up from sleep, not able to move left arm and leg, over the day, left arm and leg much improved but still feels weak.  MRI showed right MCA scattered infarcts.  So far stroke work-up unremarkable.  Vitals:   11/11/19 1145 11/11/19 1200 11/11/19 1215 11/11/19 1230  BP: (!) 141/87 (!) 152/98 (!) 126/96 (!) 146/86  Pulse: 84 92 81 84  Resp: 13 20 15 16   Temp:      TempSrc:      SpO2: 98% 95% 95% 95%  Weight:      Height:        CBC:  Recent Labs  Lab 11/11/19 0437 11/11/19 0443  WBC  --  9.5  NEUTROABS  --  7.1  HGB 15.6* 15.7*  HCT 46.0 47.2*  MCV  --  91.1  PLT  --  280    Basic Metabolic Panel:  Recent Labs  Lab 11/11/19 0437 11/11/19 0443  NA 141 139  K 4.0 4.0  CL 105 103  CO2  --  24  GLUCOSE 145* 142*  BUN 13 12  CREATININE 0.70 0.85  CALCIUM  --  9.6   Lipid Panel: No results found for: CHOL, TRIG, HDL, CHOLHDL, VLDL, LDLCALC HgbA1c: No results found for: HGBA1C Urine Drug Screen:     Component Value Date/Time   LABOPIA NONE DETECTED 11/11/2019 0338   COCAINSCRNUR NONE DETECTED 11/11/2019 0338   LABBENZ NONE DETECTED 11/11/2019 0338   AMPHETMU NONE DETECTED 11/11/2019 0338   THCU NONE DETECTED 11/11/2019 0338   LABBARB NONE DETECTED 11/11/2019 0338    Alcohol Level     Component Value Date/Time   ETH <10 11/11/2019 0443    IMAGING past 48 hours CT ANGIO HEAD W OR WO CONTRAST  Result Date: 11/11/2019 CLINICAL DATA:  Stroke, follow-up. Additional history provided: Sudden onset left-sided numbness and weakness, which is improving since 2:30 a.m. EXAM: CT ANGIOGRAPHY HEAD AND NECK TECHNIQUE: Multidetector CT imaging of the head and neck was performed using the standard protocol during bolus administration of intravenous contrast. Multiplanar CT image reconstructions and MIPs were obtained to evaluate the vascular anatomy. Carotid stenosis  measurements (when applicable) are obtained utilizing NASCET criteria, using the distal internal carotid diameter as the denominator. CONTRAST:  25mL OMNIPAQUE IOHEXOL 350 MG/ML SOLN COMPARISON:  Noncontrast head CT 11/11/2019 FINDINGS: CTA NECK FINDINGS Aortic arch: Standard aortic branching. No significant atherosclerotic disease within the visualized aortic arch. Right carotid system: CCA and ICA patent within the neck without stenosis. No significant atherosclerotic disease. Left carotid system: CCA and ICA patent within the neck without stenosis. No significant atherosclerotic disease. Vertebral arteries: Codominant. Patent within the neck without stenosis. Skeleton: Cervical spondylosis. This includes somewhat prominent posterior disc osteophytes at the C4-C5 and C5-C6 levels, contributing to at least mild/moderate spinal canal stenosis. Age-indeterminate although presumed chronic T4 superior endplate compression fracture. T2 vertebral body hemangioma. Other neck: No neck mass or cervical lymphadenopathy. Upper chest: No consolidation within the imaged lung apices. Review of the MIP images confirms the above findings CTA HEAD FINDINGS Anterior circulation: The intracranial internal carotid arteries are patent without stenosis. Minimal calcified plaque within these vessels bilaterally. The M1 right middle cerebral artery is patent without stenosis. No M2 proximal branch occlusion or high-grade proximal stenosis. The high-grade right M2/M3 branch stenosis demonstrated on MRA performed earlier the same day is not well appreciated on the  current study. The right anterior cerebral artery is patent without significant proximal stenosis. The M1 left middle cerebral artery is patent without significant stenosis. No M2 proximal branch occlusion or high-grade proximal stenosis is identified. Atherosclerotic irregularity of a left M3/M4 branche was better appreciated on prior MRA. The left anterior cerebral artery is  patent without significant proximal stenosis. No intracranial aneurysm is identified Posterior circulation: The intracranial vertebral arteries are patent without significant stenosis, as is the basilar artery. Small fenestration within the proximal basilar artery redemonstrated. There are sizable bilateral posterior communicating arteries which are patent. The posterior cerebral arteries are patent bilaterally without significant proximal stenosis. Venous sinuses: Within limitations of contrast timing, no convincing thrombus. Anatomic variants: None significant Review of the MIP images confirms the above findings IMPRESSION: CTA neck: 1. The common carotid, internal carotid and vertebral arteries are patent within the neck without significant stenosis. 2. T4 superior endplate compression deformity, age-indeterminate although presumed chronic. Clinical correlation is recommended. CTA head: 1. No intracranial large vessel occlusion. 2. No proximal M2 branch occlusion or high-grade stenosis identified. 3. A high-grade right M2/M3 branch short-segment narrowing was better appreciated on prior MRA. 4. Atherosclerotic irregularity of a left M3/M4 MCA branch was also better appreciated on prior MRA. Electronically Signed   By: Jackey LogeKyle  Golden DO   On: 11/11/2019 10:30   CT ANGIO NECK W OR WO CONTRAST  Result Date: 11/11/2019 CLINICAL DATA:  Stroke, follow-up. Additional history provided: Sudden onset left-sided numbness and weakness, which is improving since 2:30 a.m. EXAM: CT ANGIOGRAPHY HEAD AND NECK TECHNIQUE: Multidetector CT imaging of the head and neck was performed using the standard protocol during bolus administration of intravenous contrast. Multiplanar CT image reconstructions and MIPs were obtained to evaluate the vascular anatomy. Carotid stenosis measurements (when applicable) are obtained utilizing NASCET criteria, using the distal internal carotid diameter as the denominator. CONTRAST:  75mL OMNIPAQUE  IOHEXOL 350 MG/ML SOLN COMPARISON:  Noncontrast head CT 11/11/2019 FINDINGS: CTA NECK FINDINGS Aortic arch: Standard aortic branching. No significant atherosclerotic disease within the visualized aortic arch. Right carotid system: CCA and ICA patent within the neck without stenosis. No significant atherosclerotic disease. Left carotid system: CCA and ICA patent within the neck without stenosis. No significant atherosclerotic disease. Vertebral arteries: Codominant. Patent within the neck without stenosis. Skeleton: Cervical spondylosis. This includes somewhat prominent posterior disc osteophytes at the C4-C5 and C5-C6 levels, contributing to at least mild/moderate spinal canal stenosis. Age-indeterminate although presumed chronic T4 superior endplate compression fracture. T2 vertebral body hemangioma. Other neck: No neck mass or cervical lymphadenopathy. Upper chest: No consolidation within the imaged lung apices. Review of the MIP images confirms the above findings CTA HEAD FINDINGS Anterior circulation: The intracranial internal carotid arteries are patent without stenosis. Minimal calcified plaque within these vessels bilaterally. The M1 right middle cerebral artery is patent without stenosis. No M2 proximal branch occlusion or high-grade proximal stenosis. The high-grade right M2/M3 branch stenosis demonstrated on MRA performed earlier the same day is not well appreciated on the current study. The right anterior cerebral artery is patent without significant proximal stenosis. The M1 left middle cerebral artery is patent without significant stenosis. No M2 proximal branch occlusion or high-grade proximal stenosis is identified. Atherosclerotic irregularity of a left M3/M4 branche was better appreciated on prior MRA. The left anterior cerebral artery is patent without significant proximal stenosis. No intracranial aneurysm is identified Posterior circulation: The intracranial vertebral arteries are patent without  significant stenosis, as is the basilar artery. Small  fenestration within the proximal basilar artery redemonstrated. There are sizable bilateral posterior communicating arteries which are patent. The posterior cerebral arteries are patent bilaterally without significant proximal stenosis. Venous sinuses: Within limitations of contrast timing, no convincing thrombus. Anatomic variants: None significant Review of the MIP images confirms the above findings IMPRESSION: CTA neck: 1. The common carotid, internal carotid and vertebral arteries are patent within the neck without significant stenosis. 2. T4 superior endplate compression deformity, age-indeterminate although presumed chronic. Clinical correlation is recommended. CTA head: 1. No intracranial large vessel occlusion. 2. No proximal M2 branch occlusion or high-grade stenosis identified. 3. A high-grade right M2/M3 branch short-segment narrowing was better appreciated on prior MRA. 4. Atherosclerotic irregularity of a left M3/M4 MCA branch was also better appreciated on prior MRA. Electronically Signed   By: Kellie Simmering DO   On: 11/11/2019 10:30   MR ANGIO HEAD WO CONTRAST  Result Date: 11/11/2019 CLINICAL DATA:  Left-sided heaviness beginning overnight EXAM: MRI HEAD WITHOUT CONTRAST MRA HEAD WITHOUT CONTRAST TECHNIQUE: Multiplanar, multiecho pulse sequences of the brain and surrounding structures were obtained without intravenous contrast. Angiographic images of the head were obtained using MRA technique without contrast. COMPARISON:  None. FINDINGS: MRI HEAD FINDINGS Brain: Cluster of small acute infarcts along the white matter and cortex of the right high frontal and parietal lobes, MCA branch distribution. Few remote small vessel ischemic type injuries in the cerebral white matter. No evident prior cortical infarct. Mild cerebral volume loss. No hemorrhage, hydrocephalus, or masslike finding. Vascular: Arterial findings below. Skull and upper cervical  spine: Negative for marrow lesion. Hyperostosis interna. Sinuses/Orbits: Partial right mastoid opacification with negative nasopharynx. Mild mucosal thickening in the left maxillary sinus. Bilateral cataract resection MRA HEAD FINDINGS Small vertebral and basilar arteries in the setting of large posterior communicating arteries. There is a proximal basilar fenestration. No branch occlusion, flow limiting stenosis, or aneurysm seen in the posterior circulation. Irregularity of the bilateral carotid siphon attributed to artifact and probable atherosclerosis. No branch occlusion, flow limiting stenosis, or beading seen on the right. There is a high-grade right M2/3 branch narrowing with short segment flow gap. Atheromatous type irregularity of a left M3/4 branch is also noted. IMPRESSION: 1. 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 2. Intracranial atherosclerosis with high-grade right M2/3 branch stenosis. Electronically Signed   By: Monte Fantasia M.D.   On: 11/11/2019 06:59   MR BRAIN WO CONTRAST  Result Date: 11/11/2019 CLINICAL DATA:  Left-sided heaviness beginning overnight EXAM: MRI HEAD WITHOUT CONTRAST MRA HEAD WITHOUT CONTRAST TECHNIQUE: Multiplanar, multiecho pulse sequences of the brain and surrounding structures were obtained without intravenous contrast. Angiographic images of the head were obtained using MRA technique without contrast. COMPARISON:  None. FINDINGS: MRI HEAD FINDINGS Brain: Cluster of small acute infarcts along the white matter and cortex of the right high frontal and parietal lobes, MCA branch distribution. Few remote small vessel ischemic type injuries in the cerebral white matter. No evident prior cortical infarct. Mild cerebral volume loss. No hemorrhage, hydrocephalus, or masslike finding. Vascular: Arterial findings below. Skull and upper cervical spine: Negative for marrow lesion. Hyperostosis interna.  Sinuses/Orbits: Partial right mastoid opacification with negative nasopharynx. Mild mucosal thickening in the left maxillary sinus. Bilateral cataract resection MRA HEAD FINDINGS Small vertebral and basilar arteries in the setting of large posterior communicating arteries. There is a proximal basilar fenestration. No branch occlusion, flow limiting stenosis, or aneurysm seen in the posterior  circulation. Irregularity of the bilateral carotid siphon attributed to artifact and probable atherosclerosis. No branch occlusion, flow limiting stenosis, or beading seen on the right. There is a high-grade right M2/3 branch narrowing with short segment flow gap. Atheromatous type irregularity of a left M3/4 branch is also noted. IMPRESSION: 1. 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 2. Intracranial atherosclerosis with high-grade right M2/3 branch stenosis. Electronically Signed   By: Marnee Spring M.D.   On: 11/11/2019 06:59   CT HEAD CODE STROKE WO CONTRAST  Result Date: 11/11/2019 CLINICAL DATA:  Code stroke.  Left-sided weakness EXAM: CT HEAD WITHOUT CONTRAST TECHNIQUE: Contiguous axial images were obtained from the base of the skull through the vertex without intravenous contrast. COMPARISON:  None. FINDINGS: Brain: No evidence of acute infarction, hemorrhage, hydrocephalus, extra-axial collection or mass lesion/mass effect. Mild white matter disease. Vascular: No hyperdense vessel or unexpected calcification. Skull: Normal. Negative for fracture or focal lesion. Sinuses/Orbits: No acute finding.  Bilateral cataract resection Other: These results were communicated to Dr. Wilford Corner at 4:38 amon 1/15/2021by text page via the [facility] messaging system. ASPECTS [facility] Stroke Program Early CT Score) - Ganglionic level infarction (caudate, lentiform nuclei, internal capsule, insula, M1-M3 cortex): 7 - Supraganglionic infarction (M4-M6 cortex): 3 Total  score (0-10 with 10 being normal): 10 IMPRESSION: 1. No acute finding. 2. ASPECTS is 10. Electronically Signed   By: Marnee Spring M.D.   On: 11/11/2019 04:39    PHYSICAL EXAM  Temp:  [98.4 F (36.9 C)-98.6 F (37 C)] 98.4 F (36.9 C) (01/15 1817) Pulse Rate:  [76-92] 85 (01/15 1817) Resp:  [10-20] 18 (01/15 1817) BP: (126-174)/(78-108) 126/87 (01/15 1817) SpO2:  [93 %-98 %] 98 % (01/15 1817) Weight:  [94.3 kg] 94.3 kg (01/15 0344)  General - Well nourished, well developed, in no apparent distress.  Ophthalmologic - fundi not visualized due to noncooperation.  Cardiovascular - Regular rhythm and rate.  Mental Status -  Level of arousal and orientation to time, place, and person were intact. Language including expression, naming, repetition, comprehension was assessed and found intact. Fund of Knowledge was assessed and was intact.  Cranial Nerves II - XII - II - Visual field intact OU. III, IV, VI - Extraocular movements intact. V - Facial sensation intact bilaterally. VII - Facial movement intact bilaterally. VIII - Hearing & vestibular intact bilaterally. X - Palate elevates symmetrically. XI - Chin turning & shoulder shrug intact bilaterally. XII - Tongue protrusion intact.  Motor Strength - The patient's strength was normal in right upper and lower extremities, however left upper extremity 4/5 with left hand dexterity difficulty.  Left lower extremity 3/5.  Patient does have mild giveaway weakness.  Bulk was normal and fasciculations were absent.   Motor Tone - Muscle tone was assessed at the neck and appendages and was normal.  Reflexes - The patient's reflexes were symmetrical in all extremities and she had no pathological reflexes.  Sensory - Light touch, temperature/pinprick were assessed and were symmetrical.    Coordination - The patient had normal movements in the right hand with no ataxia or dysmetria.  Left finger-to-nose mild dysmetria proportional to the  weakness.  Tremor was absent.  Gait and Station - deferred.   ASSESSMENT/PLAN Ms. Nancy Travis is a 69 y.o. female with no past medical history presenting with L sided numbness and weakness.   Stroke:  Cluster small R MCA branch infarcts, embolic pattern, source  unclear  Code Stroke CT head No acute abnormality. ASPECTS 10.     MRI brain cluster small R MCA branch infarcts high frontal and parietal lobes.   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  Unremarkable.   2D Echo pending  LE doppler No DVT  Consider loop recorder vs 30-day cardiac event monitoring if workup unrevealing  LDL pending   HgbA1c 5.4  Lovenox 40 mg sq daily for VTE prophylaxis  No antithrombotic prior to admission, now on aspirin 81 mg daily and clopidogrel 75 mg daily following load. Continue DAPT x 3 weeks s then aspirin alone    Therapy recommendations:  pending   Disposition:  pending   Hypertension  Patient has no history of hypertension  However, on admission patient blood pressure ranging from 150-170  Currently stable  Permissive hypertension for 24- 48 hours  Hyperlipidemia   LDL pending, goal < 70  Patient against Lipitor as her husband has joint pain with Lipitor  Patient prefers Zocor if needed as her husband is on Zocor without problem.  Other Stroke Risk Factors  Advanced age  Obesity, Body mass index is 35.7 kg/m., recommend weight loss, diet and exercise as appropriate   Other Active Problems    Hospital day # 0  Marvel Plan, MD PhD Stroke Neurology 11/11/2019 7:42 PM  To contact Stroke Continuity provider, please refer to [email or web address]. After hours, contact General Neurology

## 2019-11-11 NOTE — Progress Notes (Signed)
Nutrition Brief Note  RD consulted for assessment of nutrition requirements/ status secondary to possible stroke.   Wt Readings from Last 15 Encounters:  11/11/19 94.3 kg   Nancy Travis is a 69 y.o. female with no past medical history, last seen normal at 11:30 PM on 11/10/2019, brought in for evaluation of left-sided heaviness.  She says that she went to bed normal at 11:30 PM on 11/10/2019.  She woke up around 2:30 AM feeling somewhat heavy on the left side.  She describes the feeling as her left arm and leg not obeying her commands.  She tried to get up but felt that she could not walk and her body cannot function on that r left side as well it did on the right side.  She lives in [place], has a hospital near the house, but knows that [facility] is a big stroke center.  She asked her husband to drive her to [facility].  She was evaluated here in the emergency room, NIH stroke scale was 0.Neurological consultation was obtained for ongoing subtle left-sided weakness of sudden onset.  Pt admitted with lt sided numbness and weakness.   Pt sitting up in bed, talking on phone at time of visit.   Per CareEverywhere, pt wt has been stable over the past several years.  Nutrition-Focused physical exam completed. Findings are no fat depletion, no muscle depletion, and mild edema.   Labs reviewed.   Body mass index is 35.7 kg/m. Patient meets criteria for obesity, class I based on current BMI.   Current diet order is heart healthy, patient is consuming approximately n/a% of meals at this time. Labs and medications reviewed.   No nutrition interventions warranted at this time. If nutrition issues arise, please consult RD.   Dorian Duval A. Mayford Knife, RD, LDN, CDCES Registered Dietitian II Certified Diabetes Care and Education Specialist Pager: [phone] After hours Pager: [phone]

## 2019-11-11 NOTE — ED Provider Notes (Signed)
[facility] EMERGENCY DEPARTMENT Provider Note  CSN: [id number] Arrival date & time: 11/11/19 7829  Chief Complaint(s) Extremity Weakness  HPI Nancy Travis is a 69 y.o. female with no pertinent past medical history presents to the emergency department after waking up 2 hours ago with a left-sided arm and leg weakness.  Last known normal was approximately 2330 which is 5 hours ago.  Patient reports significant difficulty with ambulation and a sensation of heaviness on the left side.  She denied any facial weakness.  No aphasia or dysarthria.  No visual changes.  No vertiginous symptoms.  She reports that her symptoms are improved from initial onset.  Denies any prior history of stroke.  No recent fevers or infections.  No other physical complaints  HPI  Past Medical History History reviewed. No pertinent past medical history. There are no problems to display for this patient.  Home Medication(s) Prior to Admission medications   Not on File                                                                                                                                    Past Surgical History History reviewed. No pertinent surgical history. Family History No family history on file.  Social History Social History   Tobacco Use  . Smoking status: Never Smoker  . Smokeless tobacco: Never Used  Substance Use Topics  . Alcohol use: Never  . Drug use: Never   Allergies Levaquin [levofloxacin], Penicillins, and Sulfa antibiotics  Review of Systems Review of Systems All other systems are reviewed and are negative for acute change except as noted in the HPI  Physical Exam Vital Signs  I have reviewed the triage vital signs BP (!) 155/103   Pulse 90   Temp 98.6 F (37 C) (Oral)   Resp 16   Ht 5\' 4"  (1.626 m)   Wt 94.3 kg   SpO2 97%   BMI 35.70 kg/m   Physical Exam Vitals reviewed.  Constitutional:      General: She is not in acute distress.  Appearance: She is well-developed. She is not diaphoretic.  HENT:     Head: Normocephalic and atraumatic.     Right Ear: External ear normal.     Left Ear: External ear normal.     Nose: Nose normal.  Eyes:     General: No scleral icterus.    Conjunctiva/sclera: Conjunctivae normal.  Neck:     Trachea: Phonation normal.  Cardiovascular:     Rate and Rhythm: Normal rate and regular rhythm.  Pulmonary:     Effort: Pulmonary effort is normal. No respiratory distress.     Breath sounds: No stridor.  Abdominal:     General: There is no distension.  Musculoskeletal:        General: Normal range of motion.     Cervical back: Normal range of motion.  Neurological:  Mental Status: She is alert and oriented to person, place, and time.     Comments: Mental Status:  Alert and oriented to person, place, and time.  Attention and concentration normal.  Speech clear.  Recent memory is intact  Cranial Nerves:  II Visual Fields: Intact to confrontation. Visual fields intact. III, IV, VI: Pupils equal and reactive to light and near. Full eye movement without nystagmus  V Facial Sensation: Normal. No weakness of masticatory muscles  VII: No facial weakness or asymmetry  VIII Auditory Acuity: Grossly normal  IX/X: The uvula is midline; the palate elevates symmetrically  XI: Normal sternocleidomastoid and trapezius strength  XII: The tongue is midline. No atrophy or fasciculations.   Motor System: Muscle Strength: 4/5 in LUE, 3+/5 to LLE. 5/5 to right extremities. Pronator drift with LUE. Muscle Tone: Tone and muscle bulk are normal in the upper and lower extremities.   Reflexes: DTRs: 1+ and symmetrical in all four extremities. No Clonus Coordination: dysmetria with FtN and HtS. No tremor.  Sensation: Intact to light touch  Gait: ataxic   Psychiatric:        Behavior: Behavior normal.     ED Results and Treatments Labs (all labs ordered are listed, but only abnormal results are  displayed) Labs Reviewed  CBC - Abnormal; Notable for the following components:      Result Value   RBC 5.18 (*)    Hemoglobin 15.7 (*)    HCT 47.2 (*)    All other components within normal limits  COMPREHENSIVE METABOLIC PANEL - Abnormal; Notable for the following components:   Glucose, Bld 142 (*)    Total Bilirubin 0.2 (*)    All other components within normal limits  URINALYSIS, ROUTINE W REFLEX MICROSCOPIC - Abnormal; Notable for the following components:   Color, Urine STRAW (*)    Specific Gravity, Urine 1.003 (*)    Leukocytes,Ua TRACE (*)    Bacteria, UA RARE (*)    All other components within normal limits  I-STAT CHEM 8, ED - Abnormal; Notable for the following components:   Glucose, Bld 145 (*)    Hemoglobin 15.6 (*)    All other components within normal limits  ETHANOL  PROTIME-INR  APTT  DIFFERENTIAL  RAPID URINE DRUG SCREEN, HOSP PERFORMED                                                                                                                         EKG  EKG Interpretation  Date/Time:  Friday November 11 2019 05:08:25 EST Ventricular Rate:  85 PR Interval:    QRS Duration: 82 QT Interval:  377 QTC Calculation: 449 R Axis:   29 Text Interpretation: Sinus rhythm NO STEMI. No old tracing to compare Confirmed by Drema Pry [phone]) on 11/11/2019 5:31:49 AM      Radiology CT HEAD CODE STROKE WO CONTRAST  Result Date: 11/11/2019 CLINICAL DATA:  Code stroke.  Left-sided weakness EXAM: CT HEAD WITHOUT CONTRAST TECHNIQUE: Contiguous  axial images were obtained from the base of the skull through the vertex without intravenous contrast. COMPARISON:  None. FINDINGS: Brain: No evidence of acute infarction, hemorrhage, hydrocephalus, extra-axial collection or mass lesion/mass effect. Mild white matter disease. Vascular: No hyperdense vessel or unexpected calcification. Skull: Normal. Negative for fracture or focal lesion. Sinuses/Orbits: No acute finding.   Bilateral cataract resection Other: These results were communicated to Dr. Wilford Corner at 4:38 amon 1/15/2021by text page via the [facility] messaging system. ASPECTS [facility] Stroke Program Early CT Score) - Ganglionic level infarction (caudate, lentiform nuclei, internal capsule, insula, M1-M3 cortex): 7 - Supraganglionic infarction (M4-M6 cortex): 3 Total score (0-10 with 10 being normal): 10 IMPRESSION: 1. No acute finding. 2. ASPECTS is 10. Electronically Signed   By: Marnee Spring M.D.   On: 11/11/2019 04:39    Pertinent labs & imaging results that were available during my care of the patient were reviewed by me and considered in my medical decision making (see chart for details).  Medications Ordered in ED Medications - No data to display                                                                                                                                  Procedures Procedures  (including critical care time)  Medical Decision Making / ED Course I have reviewed the nursing notes for this encounter and the patient's prior records (if available in EHR or on provided paperwork).   BARRETT HOLTHAUS was evaluated in Emergency Department on 11/11/2019 for the symptoms described in the history of present illness. She was evaluated in the context of the global COVID-19 pandemic, which necessitated consideration that the patient might be at risk for infection with the SARS-CoV-2 virus that causes COVID-19. Institutional protocols and algorithms that pertain to the evaluation of patients at risk for COVID-19 are in a state of rapid change based on information released by regulatory bodies including the CDC and federal and state organizations. These policies and algorithms were followed during the patient's care in the ED.  Patient has evidence concerning for possible right sided stroke. No associated headache or prior history of migraines concerning for complex migraine.  Code stroke was  initiated.   CT head without ICH.  Labs without electrolyte derangements or renal insufficiency.  Neurology evaluated patient and recommended admission for stroke work-up  We will admit to medicine for continued work-up and management.      Final Clinical Impression(s) / ED Diagnoses Final diagnoses:  None      This chart was dictated using voice recognition software.  Despite best efforts to proofread,  errors can occur which can change the documentation meaning.   Nira Conn, MD 11/11/19 [phone]

## 2019-11-12 DIAGNOSIS — I10 Essential (primary) hypertension: Secondary | ICD-10-CM | POA: Diagnosis not present

## 2019-11-12 DIAGNOSIS — I639 Cerebral infarction, unspecified: Secondary | ICD-10-CM | POA: Diagnosis not present

## 2019-11-12 LAB — LIPID PANEL
Cholesterol: 229 mg/dL — ABNORMAL HIGH (ref 0–200)
HDL: 45 mg/dL (ref >40)
LDL Cholesterol: 159 mg/dL — ABNORMAL HIGH (ref 0–99)
Total CHOL/HDL Ratio: 5.1 RATIO
Triglycerides: 127 mg/dL (ref <150)
VLDL: 25 mg/dL (ref 0–40)

## 2019-11-12 MED ORDER — ATORVASTATIN CALCIUM 80 MG PO TABS: 80.0000 mg | ORAL_TABLET | Freq: Every day | ORAL | Status: DC

## 2019-11-12 MED ORDER — CLOPIDOGREL BISULFATE 75 MG PO TABS: 75.0000 mg | ORAL_TABLET | Freq: Every day | ORAL | 0 refills | Status: AC

## 2019-11-12 MED ORDER — ASPIRIN 81 MG PO TBEC: 81.0000 mg | DELAYED_RELEASE_TABLET | Freq: Every day | ORAL | 1 refills | Status: AC

## 2019-11-12 MED ORDER — SIMVASTATIN 20 MG PO TABS: 40.0000 mg | ORAL_TABLET | Freq: Every day | ORAL | Status: DC

## 2019-11-12 MED ORDER — SIMVASTATIN 40 MG PO TABS: 40.0000 mg | ORAL_TABLET | Freq: Every day | ORAL | 1 refills | Status: DC

## 2019-11-12 NOTE — Progress Notes (Signed)
STROKE TEAM PROGRESS NOTE   INTERVAL HISTORY Patient is sitting up in a bedside chair.  She wants to go home.  She does not want a loop recorder insertion but is okay with outpatient 30-day heart monitor.  Transthoracic echo is unremarkable.  LDL cholesterol is elevated at 159 mg percent.  Vitals:   11/11/19 1314 11/11/19 1817 11/11/19 2001 11/12/19 0111  BP: (!) 152/96 126/87 (!) 147/69 128/77  Pulse: 78 85 86 72  Resp: 18 18 16 16   Temp:  98.4 F (36.9 C) 98.3 F (36.8 C) 98.4 F (36.9 C)  TempSrc: Oral Oral Oral Oral  SpO2: 98% 98% 98% 97%  Weight:   96 kg   Height:        CBC:  Recent Labs  Lab 11/11/19 0437 11/11/19 0443  WBC  --  9.5  NEUTROABS  --  7.1  HGB 15.6* 15.7*  HCT 46.0 47.2*  MCV  --  91.1  PLT  --  280    Basic Metabolic Panel:  Recent Labs  Lab 11/11/19 0437 11/11/19 0443  NA 141 139  K 4.0 4.0  CL 105 103  CO2  --  24  GLUCOSE 145* 142*  BUN 13 12  CREATININE 0.70 0.85  CALCIUM  --  9.6   Lipid Panel:     Component Value Date/Time   CHOL 229 (H) 11/12/2019 0231   TRIG 127 11/12/2019 0231   HDL 45 11/12/2019 0231   CHOLHDL 5.1 11/12/2019 0231   VLDL 25 11/12/2019 0231   LDLCALC 159 (H) 11/12/2019 0231   HgbA1c:  Lab Results  Component Value Date   HGBA1C 5.4 11/11/2019   Urine Drug Screen:     Component Value Date/Time   LABOPIA NONE DETECTED 11/11/2019 0338   COCAINSCRNUR NONE DETECTED 11/11/2019 0338   LABBENZ NONE DETECTED 11/11/2019 0338   AMPHETMU NONE DETECTED 11/11/2019 0338   THCU NONE DETECTED 11/11/2019 0338   LABBARB NONE DETECTED 11/11/2019 0338    Alcohol Level     Component Value Date/Time   ETH <10 11/11/2019 0443    IMAGING past 48 hours CT ANGIO HEAD W OR WO CONTRAST  Result Date: 11/11/2019 CLINICAL DATA:  Stroke, follow-up. Additional history provided: Sudden onset left-sided numbness and weakness, which is improving since 2:30 a.m. EXAM: CT ANGIOGRAPHY HEAD AND NECK TECHNIQUE: Multidetector CT  imaging of the head and neck was performed using the standard protocol during bolus administration of intravenous contrast. Multiplanar CT image reconstructions and MIPs were obtained to evaluate the vascular anatomy. Carotid stenosis measurements (when applicable) are obtained utilizing NASCET criteria, using the distal internal carotid diameter as the denominator. CONTRAST:  28mL OMNIPAQUE IOHEXOL 350 MG/ML SOLN COMPARISON:  Noncontrast head CT 11/11/2019 FINDINGS: CTA NECK FINDINGS Aortic arch: Standard aortic branching. No significant atherosclerotic disease within the visualized aortic arch. Right carotid system: CCA and ICA patent within the neck without stenosis. No significant atherosclerotic disease. Left carotid system: CCA and ICA patent within the neck without stenosis. No significant atherosclerotic disease. Vertebral arteries: Codominant. Patent within the neck without stenosis. Skeleton: Cervical spondylosis. This includes somewhat prominent posterior disc osteophytes at the C4-C5 and C5-C6 levels, contributing to at least mild/moderate spinal canal stenosis. Age-indeterminate although presumed chronic T4 superior endplate compression fracture. T2 vertebral body hemangioma. Other neck: No neck mass or cervical lymphadenopathy. Upper chest: No consolidation within the imaged lung apices. Review of the MIP images confirms the above findings CTA HEAD FINDINGS Anterior circulation: The intracranial internal carotid  arteries are patent without stenosis. Minimal calcified plaque within these vessels bilaterally. The M1 right middle cerebral artery is patent without stenosis. No M2 proximal branch occlusion or high-grade proximal stenosis. The high-grade right M2/M3 branch stenosis demonstrated on MRA performed earlier the same day is not well appreciated on the current study. The right anterior cerebral artery is patent without significant proximal stenosis. The M1 left middle cerebral artery is patent  without significant stenosis. No M2 proximal branch occlusion or high-grade proximal stenosis is identified. Atherosclerotic irregularity of a left M3/M4 branche was better appreciated on prior MRA. The left anterior cerebral artery is patent without significant proximal stenosis. No intracranial aneurysm is identified Posterior circulation: The intracranial vertebral arteries are patent without significant stenosis, as is the basilar artery. Small fenestration within the proximal basilar artery redemonstrated. There are sizable bilateral posterior communicating arteries which are patent. The posterior cerebral arteries are patent bilaterally without significant proximal stenosis. Venous sinuses: Within limitations of contrast timing, no convincing thrombus. Anatomic variants: None significant Review of the MIP images confirms the above findings IMPRESSION: CTA neck: 1. The common carotid, internal carotid and vertebral arteries are patent within the neck without significant stenosis. 2. T4 superior endplate compression deformity, age-indeterminate although presumed chronic. Clinical correlation is recommended. CTA head: 1. No intracranial large vessel occlusion. 2. No proximal M2 branch occlusion or high-grade stenosis identified. 3. A high-grade right M2/M3 branch short-segment narrowing was better appreciated on prior MRA. 4. Atherosclerotic irregularity of a left M3/M4 MCA branch was also better appreciated on prior MRA. Electronically Signed   By: Jackey Loge DO   On: 11/11/2019 10:30   CT ANGIO NECK W OR WO CONTRAST  Result Date: 11/11/2019 CLINICAL DATA:  Stroke, follow-up. Additional history provided: Sudden onset left-sided numbness and weakness, which is improving since 2:30 a.m. EXAM: CT ANGIOGRAPHY HEAD AND NECK TECHNIQUE: Multidetector CT imaging of the head and neck was performed using the standard protocol during bolus administration of intravenous contrast. Multiplanar CT image reconstructions  and MIPs were obtained to evaluate the vascular anatomy. Carotid stenosis measurements (when applicable) are obtained utilizing NASCET criteria, using the distal internal carotid diameter as the denominator. CONTRAST:  75mL OMNIPAQUE IOHEXOL 350 MG/ML SOLN COMPARISON:  Noncontrast head CT 11/11/2019 FINDINGS: CTA NECK FINDINGS Aortic arch: Standard aortic branching. No significant atherosclerotic disease within the visualized aortic arch. Right carotid system: CCA and ICA patent within the neck without stenosis. No significant atherosclerotic disease. Left carotid system: CCA and ICA patent within the neck without stenosis. No significant atherosclerotic disease. Vertebral arteries: Codominant. Patent within the neck without stenosis. Skeleton: Cervical spondylosis. This includes somewhat prominent posterior disc osteophytes at the C4-C5 and C5-C6 levels, contributing to at least mild/moderate spinal canal stenosis. Age-indeterminate although presumed chronic T4 superior endplate compression fracture. T2 vertebral body hemangioma. Other neck: No neck mass or cervical lymphadenopathy. Upper chest: No consolidation within the imaged lung apices. Review of the MIP images confirms the above findings CTA HEAD FINDINGS Anterior circulation: The intracranial internal carotid arteries are patent without stenosis. Minimal calcified plaque within these vessels bilaterally. The M1 right middle cerebral artery is patent without stenosis. No M2 proximal branch occlusion or high-grade proximal stenosis. The high-grade right M2/M3 branch stenosis demonstrated on MRA performed earlier the same day is not well appreciated on the current study. The right anterior cerebral artery is patent without significant proximal stenosis. The M1 left middle cerebral artery is patent without significant stenosis. No M2 proximal branch occlusion or high-grade  proximal stenosis is identified. Atherosclerotic irregularity of a left M3/M4 branche was  better appreciated on prior MRA. The left anterior cerebral artery is patent without significant proximal stenosis. No intracranial aneurysm is identified Posterior circulation: The intracranial vertebral arteries are patent without significant stenosis, as is the basilar artery. Small fenestration within the proximal basilar artery redemonstrated. There are sizable bilateral posterior communicating arteries which are patent. The posterior cerebral arteries are patent bilaterally without significant proximal stenosis. Venous sinuses: Within limitations of contrast timing, no convincing thrombus. Anatomic variants: None significant Review of the MIP images confirms the above findings IMPRESSION: CTA neck: 1. The common carotid, internal carotid and vertebral arteries are patent within the neck without significant stenosis. 2. T4 superior endplate compression deformity, age-indeterminate although presumed chronic. Clinical correlation is recommended. CTA head: 1. No intracranial large vessel occlusion. 2. No proximal M2 branch occlusion or high-grade stenosis identified. 3. A high-grade right M2/M3 branch short-segment narrowing was better appreciated on prior MRA. 4. Atherosclerotic irregularity of a left M3/M4 MCA branch was also better appreciated on prior MRA. Electronically Signed   By: Jackey LogeKyle  Golden DO   On: 11/11/2019 10:30   MR ANGIO HEAD WO CONTRAST  Result Date: 11/11/2019 CLINICAL DATA:  Left-sided heaviness beginning overnight EXAM: MRI HEAD WITHOUT CONTRAST MRA HEAD WITHOUT CONTRAST TECHNIQUE: Multiplanar, multiecho pulse sequences of the brain and surrounding structures were obtained without intravenous contrast. Angiographic images of the head were obtained using MRA technique without contrast. COMPARISON:  None. FINDINGS: MRI HEAD FINDINGS Brain: Cluster of small acute infarcts along the white matter and cortex of the right high frontal and parietal lobes, MCA branch distribution. Few remote small  vessel ischemic type injuries in the cerebral white matter. No evident prior cortical infarct. Mild cerebral volume loss. No hemorrhage, hydrocephalus, or masslike finding. Vascular: Arterial findings below. Skull and upper cervical spine: Negative for marrow lesion. Hyperostosis interna. Sinuses/Orbits: Partial right mastoid opacification with negative nasopharynx. Mild mucosal thickening in the left maxillary sinus. Bilateral cataract resection MRA HEAD FINDINGS Small vertebral and basilar arteries in the setting of large posterior communicating arteries. There is a proximal basilar fenestration. No branch occlusion, flow limiting stenosis, or aneurysm seen in the posterior circulation. Irregularity of the bilateral carotid siphon attributed to artifact and probable atherosclerosis. No branch occlusion, flow limiting stenosis, or beading seen on the right. There is a high-grade right M2/3 branch narrowing with short segment flow gap. Atheromatous type irregularity of a left M3/4 branch is also noted. IMPRESSION: 1. 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 2. Intracranial atherosclerosis with high-grade right M2/3 branch stenosis. Electronically Signed   By: Marnee SpringJonathon  Watts M.D.   On: 11/11/2019 06:59   MR BRAIN WO CONTRAST  Result Date: 11/11/2019 CLINICAL DATA:  Left-sided heaviness beginning overnight EXAM: MRI HEAD WITHOUT CONTRAST MRA HEAD WITHOUT CONTRAST TECHNIQUE: Multiplanar, multiecho pulse sequences of the brain and surrounding structures were obtained without intravenous contrast. Angiographic images of the head were obtained using MRA technique without contrast. COMPARISON:  None. FINDINGS: MRI HEAD FINDINGS Brain: Cluster of small acute infarcts along the white matter and cortex of the right high frontal and parietal lobes, MCA branch distribution. Few remote small vessel ischemic type injuries in the cerebral white matter.  No evident prior cortical infarct. Mild cerebral volume loss. No hemorrhage, hydrocephalus, or masslike finding. Vascular: Arterial findings below. Skull and upper cervical spine: Negative for marrow lesion. Hyperostosis interna. Sinuses/Orbits: Partial  right mastoid opacification with negative nasopharynx. Mild mucosal thickening in the left maxillary sinus. Bilateral cataract resection MRA HEAD FINDINGS Small vertebral and basilar arteries in the setting of large posterior communicating arteries. There is a proximal basilar fenestration. No branch occlusion, flow limiting stenosis, or aneurysm seen in the posterior circulation. Irregularity of the bilateral carotid siphon attributed to artifact and probable atherosclerosis. No branch occlusion, flow limiting stenosis, or beading seen on the right. There is a high-grade right M2/3 branch narrowing with short segment flow gap. Atheromatous type irregularity of a left M3/4 branch is also noted. IMPRESSION: 1. 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 2. Intracranial atherosclerosis with high-grade right M2/3 branch stenosis. Electronically Signed   By: Marnee Spring M.D.   On: 11/11/2019 06:59   ECHOCARDIOGRAM COMPLETE  Result Date: 11/11/2019   ECHOCARDIOGRAM REPORT   Patient Name:   Nancy Travis Date of Exam: 11/11/2019 Medical Rec #:  [id number]           Height:       64.0 in Accession #:    [id number]          Weight:       208.0 lb Date of Birth:  July 10, 1951           BSA:          1.99 m Patient Age:    68 years            BP:           157/83 mmHg Patient Gender: F                   HR:           86 bpm. Exam Location:  Inpatient Procedure: 2D Echo Indications:    Stroke I163.9  History:        Patient has no prior history of Echocardiogram examinations.  Sonographer:    Thurman Coyer RDCS (AE) Referring Phys: [id number] JENNIFER YATES IMPRESSIONS  1. Left ventricular ejection  fraction, by visual estimation, is 60 to 65%. The left ventricle has normal function. There is mildly increased left ventricular hypertrophy.  2. Global right ventricle has normal systolic function.The right ventricular size is normal.  3. Left atrial size was normal.  4. Right atrial size was normal.  5. The mitral valve is normal in structure. No evidence of mitral valve regurgitation.  6. The tricuspid valve is grossly normal.  7. The aortic valve is normal in structure. Aortic valve regurgitation is not visualized. No evidence of aortic valve stenosis.  8. The pulmonic valve was not well visualized. Pulmonic valve regurgitation is not visualized.  9. The inferior vena cava is normal in size with greater than 50% respiratory variability, suggesting right atrial pressure of 3 mmHg. 10. TR signal is inadequate for assessing pulmonary artery systolic pressure. FINDINGS  Left Ventricle: Left ventricular ejection fraction, by visual estimation, is 60 to 65%. The left ventricle has normal function. The left ventricle has no regional wall motion abnormalities. There is mildly increased left ventricular hypertrophy. Left ventricular diastolic parameters were normal. Right Ventricle: The right ventricular size is normal. No increase in right ventricular wall thickness. Global RV systolic function is has normal systolic function. Left Atrium: Left atrial size was normal in size. Right Atrium: Right atrial size was normal in size Pericardium: Trivial pericardial effusion is present. Mitral Valve: The mitral valve is normal in  structure. No evidence of mitral valve regurgitation. Tricuspid Valve: The tricuspid valve is grossly normal. Tricuspid valve regurgitation is not demonstrated. Aortic Valve: The aortic valve is normal in structure. Aortic valve regurgitation is not visualized. The aortic valve is structurally normal, with no evidence of sclerosis or stenosis. Pulmonic Valve: The pulmonic valve was not well visualized.  Pulmonic valve regurgitation is not visualized. Pulmonic regurgitation is not visualized. Aorta: The aortic root and ascending aorta are structurally normal, with no evidence of dilitation. Venous: The inferior vena cava is normal in size with greater than 50% respiratory variability, suggesting right atrial pressure of 3 mmHg. IAS/Shunts: The interatrial septum was not well visualized.  LEFT VENTRICLE PLAX 2D LVIDd:         2.64 cm  Diastology LVIDs:         2.08 cm  LV e' lateral:   10.30 cm/s LV PW:         0.84 cm  LV E/e' lateral: 6.1 LV IVS:        1.15 cm  LV e' medial:    6.74 cm/s LVOT diam:     2.10 cm  LV E/e' medial:  9.3 LV SV:         11 ml LV SV Index:   5.46 LVOT Area:     3.46 cm  RIGHT VENTRICLE RV S prime:     17.70 cm/s TAPSE (M-mode): 1.7 cm LEFT ATRIUM           Index       RIGHT ATRIUM          Index LA diam:      2.60 cm 1.31 cm/m  RA Area:     9.55 cm LA Vol (A2C): 19.8 ml 9.95 ml/m  RA Volume:   16.90 ml 8.50 ml/m LA Vol (A4C): 20.4 ml 10.26 ml/m  AORTIC VALVE LVOT Vmax:   80.30 cm/s LVOT Vmean:  58.100 cm/s LVOT VTI:    0.138 m  AORTA Ao Root diam: 2.90 cm MITRAL VALVE MV Area (PHT): 3.30 cm             SHUNTS MV PHT:        66.70 msec           Systemic VTI:  0.14 m MV Decel Time: 230 msec             Systemic Diam: 2.10 cm MV E velocity: 63.00 cm/s 103 cm/s MV A velocity: 73.30 cm/s 70.3 cm/s MV E/A ratio:  0.86       1.5  Epifanio Lesches MD Electronically signed by Epifanio Lesches MD Signature Date/Time: 11/11/2019/7:08:45 PM    Final    CT HEAD CODE STROKE WO CONTRAST  Result Date: 11/11/2019 CLINICAL DATA:  Code stroke.  Left-sided weakness EXAM: CT HEAD WITHOUT CONTRAST TECHNIQUE: Contiguous axial images were obtained from the base of the skull through the vertex without intravenous contrast. COMPARISON:  None. FINDINGS: Brain: No evidence of acute infarction, hemorrhage, hydrocephalus, extra-axial collection or mass lesion/mass effect. Mild white matter disease.  Vascular: No hyperdense vessel or unexpected calcification. Skull: Normal. Negative for fracture or focal lesion. Sinuses/Orbits: No acute finding.  Bilateral cataract resection Other: These results were communicated to Dr. Wilford Corner at 4:38 amon 1/15/2021by text page via the [facility] messaging system. ASPECTS [facility] Stroke Program Early CT Score) - Ganglionic level infarction (caudate, lentiform nuclei, internal capsule, insula, M1-M3 cortex): 7 - Supraganglionic infarction (M4-M6 cortex): 3 Total score (0-10 with 10 being normal):  10 IMPRESSION: 1. No acute finding. 2. ASPECTS is 10. Electronically Signed   By: Monte Fantasia M.D.   On: 11/11/2019 04:39   VAS [place] LOWER EXTREMITY VENOUS (DVT)  Result Date: 11/11/2019  Lower Venous Study Indications: Stroke.  Comparison Study: no prior Performing Technologist: Abram Sander RVS  Examination Guidelines: A complete evaluation includes B-mode imaging, spectral Doppler, color Doppler, and power Doppler as needed of all accessible portions of each vessel. Bilateral testing is considered an integral part of a complete examination. Limited examinations for reoccurring indications may be performed as noted.  +---------+---------------+---------+-----------+----------+--------------+ RIGHT    CompressibilityPhasicitySpontaneityPropertiesThrombus Aging +---------+---------------+---------+-----------+----------+--------------+ CFV      Full           Yes      Yes                                 +---------+---------------+---------+-----------+----------+--------------+ SFJ      Full                                                        +---------+---------------+---------+-----------+----------+--------------+ FV Prox  Full                                                        +---------+---------------+---------+-----------+----------+--------------+ FV Mid   Full                                                         +---------+---------------+---------+-----------+----------+--------------+ FV DistalFull                                                        +---------+---------------+---------+-----------+----------+--------------+ PFV      Full                                                        +---------+---------------+---------+-----------+----------+--------------+ POP      Full           Yes      Yes                                 +---------+---------------+---------+-----------+----------+--------------+ PTV      Full                                                        +---------+---------------+---------+-----------+----------+--------------+ PERO     Full                                                        +---------+---------------+---------+-----------+----------+--------------+   +---------+---------------+---------+-----------+----------+--------------+  LEFT     CompressibilityPhasicitySpontaneityPropertiesThrombus Aging +---------+---------------+---------+-----------+----------+--------------+ CFV      Full           Yes      Yes                                 +---------+---------------+---------+-----------+----------+--------------+ SFJ      Full                                                        +---------+---------------+---------+-----------+----------+--------------+ FV Prox  Full                                                        +---------+---------------+---------+-----------+----------+--------------+ FV Mid   Full                                                        +---------+---------------+---------+-----------+----------+--------------+ FV DistalFull                                                        +---------+---------------+---------+-----------+----------+--------------+ PFV      Full                                                         +---------+---------------+---------+-----------+----------+--------------+ POP      Full           Yes      Yes                                 +---------+---------------+---------+-----------+----------+--------------+ PTV      Full                                                        +---------+---------------+---------+-----------+----------+--------------+ PERO     Full                                                        +---------+---------------+---------+-----------+----------+--------------+     Summary: Right: There is no evidence of deep vein thrombosis in the lower extremity. No cystic structure found in the popliteal fossa. Left: There is no evidence of deep vein thrombosis in the lower extremity. No cystic structure found in the popliteal fossa.  *  See table(s) above for measurements and observations. Electronically signed by Sherald Hesshristopher Clark MD on 11/11/2019 at 4:03:22 PM.    Final     PHYSICAL EXAM  Temp:  [98.3 F (36.8 C)-98.4 F (36.9 C)] 98.4 F (36.9 C) (01/16 0111) Pulse Rate:  [72-92] 72 (01/16 0111) Resp:  [10-20] 16 (01/16 0111) BP: (126-174)/(69-108) 128/77 (01/16 0111) SpO2:  [93 %-98 %] 97 % (01/16 0111) Weight:  [96 kg] 96 kg (01/15 2001)  General - Well nourished, well developed pleasant middle-aged Caucasian lady, in no apparent distress.  Ophthalmologic - fundi not visualized due to noncooperation.  Cardiovascular - Regular rhythm and rate.  Mental Status -  Level of arousal and orientation to time, place, and person were intact. Language including expression, naming, repetition, comprehension was assessed and found intact. Fund of Knowledge was assessed and was intact.  Cranial Nerves II - XII - II - Visual field intact OU. III, IV, VI - Extraocular movements intact. V - Facial sensation intact bilaterally. VII - Facial movement intact bilaterally. VIII - Hearing & vestibular intact bilaterally. X - Palate elevates  symmetrically. XI - Chin turning & shoulder shrug intact bilaterally. XII - Tongue protrusion intact.  Motor Strength - The patient's strength was normal in right upper and lower extremities, however left upper extremity 4/5 with left hand dexterity difficulty.  Left lower extremity 4/5.     Bulk was normal and fasciculations were absent.   Motor Tone - Muscle tone was assessed at the neck and appendages and was normal.  Reflexes - The patient's reflexes were symmetrical in all extremities and she had no pathological reflexes.  Sensory - Light touch, temperature/pinprick were assessed and were symmetrical.    Coordination - The patient had normal movements in the right hand with no ataxia or dysmetria.  Left finger-to-nose mild dysmetria proportional to the weakness.  Tremor was absent.  Gait and Station - deferred.   ASSESSMENT/PLAN Ms. Nancy Travis is a 69 y.o. female with no past medical history presenting with L sided numbness and weakness.   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 ASPECTS 10.     MRI brain cluster small R MCA branch infarcts high frontal and parietal lobes.   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  Unremarkable.   2D Echo normal ejection fraction.  No cardiac source of embolism.  LE doppler No DVT  Consider loop recorder vs 30-day cardiac event monitoring if workup unrevealing  LDL 159 mg percent  HgbA1c 5.4  Lovenox 40 mg sq daily for VTE prophylaxis  No antithrombotic prior to admission, now on aspirin 81 mg daily and clopidogrel 75 mg daily following load. Continue DAPT x 3 weeks s then aspirin alone    Therapy recommendations:  pending   Disposition:  pending   Hypertension  Patient has no history of hypertension  However, on admission patient blood pressure ranging from  150-170  Currently stable  Permissive hypertension for 24- 48 hours  Hyperlipidemia   LDL 159 mg percent, goal < 70  Patient against Lipitor as her husband has joint pain with Lipitor  Patient prefers Zocor if needed as her husband is on Zocor without problem.  Other Stroke Risk Factors  Advanced age  Obesity, Body mass index is 36.33 kg/m., recommend weight loss, diet and exercise as appropriate   Other Active  Problems    Hospital day # 1  I have personally obtained history,examined this patient, reviewed notes, independently viewed imaging studies, participated in medical decision making and plan of care.ROS completed by me personally and pertinent positives fully documented  I have made any additions or clarifications directly to the above note.  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  Recommend outpatient 30-day heart monitor to be set up by cardiology as patient is not willing to consider loop recorder insertion.  Continue aspirin Plavix for 3 weeks followed by aspirin alone and aggressive risk factor modification.  Discussed with Dr. Dayna Barker.  Greater than 50% time during this 25-minute visit was spent on counseling and coordination of care about her embolic strokes and answering questions.  Follow-up as an outpatient stroke clinic in 6 weeks  Delia Heady, MD Medical Director [facility] Pager: [phone] 11/12/2019 2:56 PM  To contact Stroke Continuity provider, please refer to [email or web address]. After hours, contact General Neurology

## 2019-11-12 NOTE — Discharge Summary (Signed)
Physician Discharge Summary  Nancy Travis:[id number] DOB: July 29, 1951 DOA: 11/11/2019  PCP: Patient, No Pcp Per  Admit date: 11/11/2019 Discharge date: 11/12/2019  Admitted From: home Disposition:  home  Recommendations for Outpatient Follow-up:  1. Follow up with PCP in 1-2 weeks, outpatient neurology follow-up 2. Please obtain BMP/CBC in one week 3. Please follow up on the following pending results:  Home Health:no  Equipment/Devices: non  Discharge Condition: Stable Code Status: full Diet recommendation: Heart Healthy  Brief/Interim Summary:  69 y.o. female with no significant medical history presents with left-sided weakness.  As per HPI she watched the late news during night and went to bed about 1130 pm and went to sleep-sometime thereafter, she woke up and was unable to turn over.  Her body would just not work.  She couldn't get her left arm to work at all, felt like a dead weight.  She managed to get up out of bed and placed her weight on her left leg and it wasn't able to hold her up.  Her husband woke up and they were concerned that this was a stroke.  She was seen in the ED and was admitted 1/15 Neurology consulted, not a candidate for TPA, And completed stroke evaluation, found to have: Cluster small right MCA branch infarcts, embolic pattern, source unclear. MRA head intracranial atherosclerosis with high-grade R M2/3 branch stenosis.  CT head and neck no LVO.  LE Doppler no DVT.  2D echo EF 60 to 65%, no valve abnormalities.  LDL 159, hemoglobin A1c 5.4 Patient even though was admitted inpatient with expected length of stay more than 2 midnights, WITH STARTED THERAPY she had remarkable improvement in her left-sided weakness seen by PT and has advised outpatient PT OT.  Seen by neurology at this time okay for discharge home.  She is placed on DAPT x3 weeks then aspirin alone, initiated on Zocor as patient reported her husband and other family numbers did not tolerate  Lipitor.  Diet reduction provided.  She will follow-up with neurology.  Cardiology PA Nehemiah Settle paged to arrange for outpatient 30-day event monitoring.  Disposition: Patient even though was admitted inpatient with expected length of stay more than 2 midnights, WITH STARTED THERAPY she had remarkable improvement in her left-sided weakness and she is being discharged home in less than 2 midnights. Discussed with neurology regarding discharge and recommendations.  Discharge Diagnoses:   Acute CVA  Obesity with body mass index of 30.0-39.9 Hyperlipidemia Refer to previous notes admission and consult notes for more details.  Consults:  Neurology  Subjective: Resting comfortably at the bedside chair.  Had significant improvement in the left side weakness.  Patient reports he is able to ambulate has intermittent LEFT leg limping Wants to go home Discharge Exam: Vitals:   11/12/19 0843 11/12/19 1236  BP: (!) 141/87 (!) 142/75  Pulse: 89 70  Resp:  16  Temp:  98.1 F (36.7 C)  SpO2:  97%   General: Pt is alert, awake, not in acute distress Cardiovascular: RRR, S1/S2 +, no rubs, no gallops Respiratory: CTA bilaterally, no wheezing, no rhonchi Abdominal: Soft, NT, ND, bowel sounds + Extremities: no edema, no cyanosis mild left,  Discharge Instructions  Discharge Instructions    Ambulatory referral to Neurology   Complete by: As directed    An appointment is requested in approximately: post stroke follow up.   Diet - low sodium heart healthy   Complete by: As directed    Discharge instructions   Complete  by: As directed    Please call call MD or return to ER for similar or worsening recurring problem that brought you to hospital or if any fever,nausea/vomiting,abdominal pain, uncontrolled pain, chest pain,  shortness of breath or any other alarming symptoms.  Please follow-up your doctor as instructed in a week time and call the office for appointment.  Please avoid alcohol,  smoking, or any other illicit substance and maintain healthy habits including taking your regular medications as prescribed.  You were cared for by a hospitalist during your hospital stay. If you have any questions about your discharge medications or the care you received while you were in the hospital after you are discharged, you can call the unit and ask to speak with the hospitalist on call if the hospitalist that took care of you is not available.  Once you are discharged, your primary care physician will handle any further medical issues. Please note that NO REFILLS for any discharge medications will be authorized once you are discharged, as it is imperative that you return to your primary care physician (or establish a relationship with a primary care physician if you do not have one) for your aftercare needs so that they can reassess your need for medications and monitor your lab values.  You will need 30-day cardiac monitor and will be called by cardiology clinic.  Please follow-up with Neurology for post stroke follow-up-if you do not hear from them to give them a call   Increase activity slowly   Complete by: As directed      Allergies as of 11/12/2019      Reactions   Penicillins Anaphylaxis, Hives   Did it involve swelling of the face/tongue/throat, SOB, or low BP? yes Did it involve sudden or severe rash/hives, skin peeling, or any reaction on the inside of your mouth or nose? Yes Did you need to seek medical attention at a hospital or doctor's office? yes When did it last happen?in her 41s If all above answers are "NO", may proceed with cephalosporin use.   Levaquin [levofloxacin] Nausea Only   Sulfa Antibiotics Nausea Only   Latex Rash      Medication List    TAKE these medications   acetaminophen 500 MG tablet Commonly known as: TYLENOL Take 500 mg by mouth every 6 (six) hours as needed.   aspirin 81 MG EC tablet Take 1 tablet (81 mg total) by mouth  daily. Start taking on: November 13, 2019   clopidogrel 75 MG tablet Commonly known as: PLAVIX Take 1 tablet (75 mg total) by mouth daily for 21 days. Start taking on: November 13, 2019   naproxen sodium 220 MG tablet Commonly known as: ALEVE Take 440 mg by mouth daily as needed (pain).   simvastatin 40 MG tablet Commonly known as: ZOCOR Take 1 tablet (40 mg total) by mouth daily at 6 PM.       Allergies  Allergen Reactions  . Penicillins Anaphylaxis and Hives    Did it involve swelling of the face/tongue/throat, SOB, or low BP? yes Did it involve sudden or severe rash/hives, skin peeling, or any reaction on the inside of your mouth or nose? Yes Did you need to seek medical attention at a hospital or doctor's office? yes When did it last happen?in her 63s If all above answers are "NO", may proceed with cephalosporin use.   . Levaquin [Levofloxacin] Nausea Only  . Sulfa Antibiotics Nausea Only  . Latex Rash    The results  of significant diagnostics from this hospitalization (including imaging, microbiology, ancillary and laboratory) are listed below for reference.    Microbiology: Recent Results (from the past 240 hour(s))  SARS CORONAVIRUS 2 (TAT 6-24 HRS) Nasopharyngeal Nasopharyngeal Swab     Status: None   Collection Time: 11/11/19  7:05 AM   Specimen: Nasopharyngeal Swab  Result Value Ref Range Status   SARS Coronavirus 2 NEGATIVE NEGATIVE Final    Comment: (NOTE) SARS-CoV-2 target nucleic acids are NOT DETECTED. The SARS-CoV-2 RNA is generally detectable in upper and lower respiratory specimens during the acute phase of infection. Negative results do not preclude SARS-CoV-2 infection, do not rule out co-infections with other pathogens, and should not be used as the sole basis for treatment or other patient management decisions. Negative results must be combined with clinical observations, patient history, and epidemiological information. The expected result  is Negative. Fact Sheet for Patients: HairSlick.no Fact Sheet for Healthcare Providers: quierodirigir.com This test is not yet approved or cleared by the [place] FDA and  has been authorized for detection and/or diagnosis of SARS-CoV-2 by FDA under an Emergency Use Authorization (EUA). This EUA will remain  in effect (meaning this test can be used) for the duration of the COVID-19 declaration under Section 56 4(b)(1) of the Act, 21 U.S.C. section 360bbb-3(b)(1), unless the authorization is terminated or revoked sooner. Performed at [facility], [address] [place], [place] [address]     Procedures/Studies: CT ANGIO HEAD W OR WO CONTRAST  Result Date: 11/11/2019 CLINICAL DATA:  Stroke, follow-up. Additional history provided: Sudden onset left-sided numbness and weakness, which is improving since 2:30 a.m. EXAM: CT ANGIOGRAPHY HEAD AND NECK TECHNIQUE: Multidetector CT imaging of the head and neck was performed using the standard protocol during bolus administration of intravenous contrast. Multiplanar CT image reconstructions and MIPs were obtained to evaluate the vascular anatomy. Carotid stenosis measurements (when applicable) are obtained utilizing NASCET criteria, using the distal internal carotid diameter as the denominator. CONTRAST:  65mL OMNIPAQUE IOHEXOL 350 MG/ML SOLN COMPARISON:  Noncontrast head CT 11/11/2019 FINDINGS: CTA NECK FINDINGS Aortic arch: Standard aortic branching. No significant atherosclerotic disease within the visualized aortic arch. Right carotid system: CCA and ICA patent within the neck without stenosis. No significant atherosclerotic disease. Left carotid system: CCA and ICA patent within the neck without stenosis. No significant atherosclerotic disease. Vertebral arteries: Codominant. Patent within the neck without stenosis. Skeleton: Cervical spondylosis. This includes somewhat prominent posterior  disc osteophytes at the C4-C5 and C5-C6 levels, contributing to at least mild/moderate spinal canal stenosis. Age-indeterminate although presumed chronic T4 superior endplate compression fracture. T2 vertebral body hemangioma. Other neck: No neck mass or cervical lymphadenopathy. Upper chest: No consolidation within the imaged lung apices. Review of the MIP images confirms the above findings CTA HEAD FINDINGS Anterior circulation: The intracranial internal carotid arteries are patent without stenosis. Minimal calcified plaque within these vessels bilaterally. The M1 right middle cerebral artery is patent without stenosis. No M2 proximal branch occlusion or high-grade proximal stenosis. The high-grade right M2/M3 branch stenosis demonstrated on MRA performed earlier the same day is not well appreciated on the current study. The right anterior cerebral artery is patent without significant proximal stenosis. The M1 left middle cerebral artery is patent without significant stenosis. No M2 proximal branch occlusion or high-grade proximal stenosis is identified. Atherosclerotic irregularity of a left M3/M4 branche was better appreciated on prior MRA. The left anterior cerebral artery is patent without significant proximal stenosis. No intracranial aneurysm  is identified Posterior circulation: The intracranial vertebral arteries are patent without significant stenosis, as is the basilar artery. Small fenestration within the proximal basilar artery redemonstrated. There are sizable bilateral posterior communicating arteries which are patent. The posterior cerebral arteries are patent bilaterally without significant proximal stenosis. Venous sinuses: Within limitations of contrast timing, no convincing thrombus. Anatomic variants: None significant Review of the MIP images confirms the above findings IMPRESSION: CTA neck: 1. The common carotid, internal carotid and vertebral arteries are patent within the neck without  significant stenosis. 2. T4 superior endplate compression deformity, age-indeterminate although presumed chronic. Clinical correlation is recommended. CTA head: 1. No intracranial large vessel occlusion. 2. No proximal M2 branch occlusion or high-grade stenosis identified. 3. A high-grade right M2/M3 branch short-segment narrowing was better appreciated on prior MRA. 4. Atherosclerotic irregularity of a left M3/M4 MCA branch was also better appreciated on prior MRA. Electronically Signed   By: Jackey Loge DO   On: 11/11/2019 10:30   CT ANGIO NECK W OR WO CONTRAST  Result Date: 11/11/2019 CLINICAL DATA:  Stroke, follow-up. Additional history provided: Sudden onset left-sided numbness and weakness, which is improving since 2:30 a.m. EXAM: CT ANGIOGRAPHY HEAD AND NECK TECHNIQUE: Multidetector CT imaging of the head and neck was performed using the standard protocol during bolus administration of intravenous contrast. Multiplanar CT image reconstructions and MIPs were obtained to evaluate the vascular anatomy. Carotid stenosis measurements (when applicable) are obtained utilizing NASCET criteria, using the distal internal carotid diameter as the denominator. CONTRAST:  75mL OMNIPAQUE IOHEXOL 350 MG/ML SOLN COMPARISON:  Noncontrast head CT 11/11/2019 FINDINGS: CTA NECK FINDINGS Aortic arch: Standard aortic branching. No significant atherosclerotic disease within the visualized aortic arch. Right carotid system: CCA and ICA patent within the neck without stenosis. No significant atherosclerotic disease. Left carotid system: CCA and ICA patent within the neck without stenosis. No significant atherosclerotic disease. Vertebral arteries: Codominant. Patent within the neck without stenosis. Skeleton: Cervical spondylosis. This includes somewhat prominent posterior disc osteophytes at the C4-C5 and C5-C6 levels, contributing to at least mild/moderate spinal canal stenosis. Age-indeterminate although presumed chronic T4  superior endplate compression fracture. T2 vertebral body hemangioma. Other neck: No neck mass or cervical lymphadenopathy. Upper chest: No consolidation within the imaged lung apices. Review of the MIP images confirms the above findings CTA HEAD FINDINGS Anterior circulation: The intracranial internal carotid arteries are patent without stenosis. Minimal calcified plaque within these vessels bilaterally. The M1 right middle cerebral artery is patent without stenosis. No M2 proximal branch occlusion or high-grade proximal stenosis. The high-grade right M2/M3 branch stenosis demonstrated on MRA performed earlier the same day is not well appreciated on the current study. The right anterior cerebral artery is patent without significant proximal stenosis. The M1 left middle cerebral artery is patent without significant stenosis. No M2 proximal branch occlusion or high-grade proximal stenosis is identified. Atherosclerotic irregularity of a left M3/M4 branche was better appreciated on prior MRA. The left anterior cerebral artery is patent without significant proximal stenosis. No intracranial aneurysm is identified Posterior circulation: The intracranial vertebral arteries are patent without significant stenosis, as is the basilar artery. Small fenestration within the proximal basilar artery redemonstrated. There are sizable bilateral posterior communicating arteries which are patent. The posterior cerebral arteries are patent bilaterally without significant proximal stenosis. Venous sinuses: Within limitations of contrast timing, no convincing thrombus. Anatomic variants: None significant Review of the MIP images confirms the above findings IMPRESSION: CTA neck: 1. The common carotid, internal carotid and vertebral arteries  are patent within the neck without significant stenosis. 2. T4 superior endplate compression deformity, age-indeterminate although presumed chronic. Clinical correlation is recommended. CTA head: 1.  No intracranial large vessel occlusion. 2. No proximal M2 branch occlusion or high-grade stenosis identified. 3. A high-grade right M2/M3 branch short-segment narrowing was better appreciated on prior MRA. 4. Atherosclerotic irregularity of a left M3/M4 MCA branch was also better appreciated on prior MRA. Electronically Signed   By: Jackey Loge DO   On: 11/11/2019 10:30   MR ANGIO HEAD WO CONTRAST  Result Date: 11/11/2019 CLINICAL DATA:  Left-sided heaviness beginning overnight EXAM: MRI HEAD WITHOUT CONTRAST MRA HEAD WITHOUT CONTRAST TECHNIQUE: Multiplanar, multiecho pulse sequences of the brain and surrounding structures were obtained without intravenous contrast. Angiographic images of the head were obtained using MRA technique without contrast. COMPARISON:  None. FINDINGS: MRI HEAD FINDINGS Brain: Cluster of small acute infarcts along the white matter and cortex of the right high frontal and parietal lobes, MCA branch distribution. Few remote small vessel ischemic type injuries in the cerebral white matter. No evident prior cortical infarct. Mild cerebral volume loss. No hemorrhage, hydrocephalus, or masslike finding. Vascular: Arterial findings below. Skull and upper cervical spine: Negative for marrow lesion. Hyperostosis interna. Sinuses/Orbits: Partial right mastoid opacification with negative nasopharynx. Mild mucosal thickening in the left maxillary sinus. Bilateral cataract resection MRA HEAD FINDINGS Small vertebral and basilar arteries in the setting of large posterior communicating arteries. There is a proximal basilar fenestration. No branch occlusion, flow limiting stenosis, or aneurysm seen in the posterior circulation. Irregularity of the bilateral carotid siphon attributed to artifact and probable atherosclerosis. No branch occlusion, flow limiting stenosis, or beading seen on the right. There is a high-grade right M2/3 branch narrowing with short segment flow gap. Atheromatous type  irregularity of a left M3/4 branch is also noted. IMPRESSION: 1. 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 2. Intracranial atherosclerosis with high-grade right M2/3 branch stenosis. Electronically Signed   By: Marnee Spring M.D.   On: 11/11/2019 06:59   MR BRAIN WO CONTRAST  Result Date: 11/11/2019 CLINICAL DATA:  Left-sided heaviness beginning overnight EXAM: MRI HEAD WITHOUT CONTRAST MRA HEAD WITHOUT CONTRAST TECHNIQUE: Multiplanar, multiecho pulse sequences of the brain and surrounding structures were obtained without intravenous contrast. Angiographic images of the head were obtained using MRA technique without contrast. COMPARISON:  None. FINDINGS: MRI HEAD FINDINGS Brain: Cluster of small acute infarcts along the white matter and cortex of the right high frontal and parietal lobes, MCA branch distribution. Few remote small vessel ischemic type injuries in the cerebral white matter. No evident prior cortical infarct. Mild cerebral volume loss. No hemorrhage, hydrocephalus, or masslike finding. Vascular: Arterial findings below. Skull and upper cervical spine: Negative for marrow lesion. Hyperostosis interna. Sinuses/Orbits: Partial right mastoid opacification with negative nasopharynx. Mild mucosal thickening in the left maxillary sinus. Bilateral cataract resection MRA HEAD FINDINGS Small vertebral and basilar arteries in the setting of large posterior communicating arteries. There is a proximal basilar fenestration. No branch occlusion, flow limiting stenosis, or aneurysm seen in the posterior circulation. Irregularity of the bilateral carotid siphon attributed to artifact and probable atherosclerosis. No branch occlusion, flow limiting stenosis, or beading seen on the right. There is a high-grade right M2/3 branch narrowing with short segment flow gap. Atheromatous type irregularity of a left M3/4 branch is also noted. IMPRESSION:  1. Cluster of small acute infarcts in a right MCA branch distribution  affecting the high frontal and parietal lobes. 2. Intracranial atherosclerosis with high-grade right M2/3 branch stenosis. Electronically Signed   By: Marnee Spring M.D.   On: 11/11/2019 06:59   ECHOCARDIOGRAM COMPLETE  Result Date: 11/11/2019   ECHOCARDIOGRAM REPORT   Patient Name:   Nancy Travis Date of Exam: 11/11/2019 Medical Rec #:  [id number]           Height:       64.0 in Accession #:    [id number]          Weight:       208.0 lb Date of Birth:  13-Oct-1951           BSA:          1.99 m Patient Age:    68 years            BP:           157/83 mmHg Patient Gender: F                   HR:           86 bpm. Exam Location:  Inpatient Procedure: 2D Echo Indications:    Stroke I163.9  History:        Patient has no prior history of Echocardiogram examinations.  Sonographer:    Thurman Coyer RDCS (AE) Referring Phys: [id number] JENNIFER YATES IMPRESSIONS  1. Left ventricular ejection fraction, by visual estimation, is 60 to 65%. The left ventricle has normal function. There is mildly increased left ventricular hypertrophy.  2. Global right ventricle has normal systolic function.The right ventricular size is normal.  3. Left atrial size was normal.  4. Right atrial size was normal.  5. The mitral valve is normal in structure. No evidence of mitral valve regurgitation.  6. The tricuspid valve is grossly normal.  7. The aortic valve is normal in structure. Aortic valve regurgitation is not visualized. No evidence of aortic valve stenosis.  8. The pulmonic valve was not well visualized. Pulmonic valve regurgitation is not visualized.  9. The inferior vena cava is normal in size with greater than 50% respiratory variability, suggesting right atrial pressure of 3 mmHg. 10. TR signal is inadequate for assessing pulmonary artery systolic pressure. FINDINGS  Left Ventricle: Left ventricular ejection fraction, by visual estimation, is 60 to 65%.  The left ventricle has normal function. The left ventricle has no regional wall motion abnormalities. There is mildly increased left ventricular hypertrophy. Left ventricular diastolic parameters were normal. Right Ventricle: The right ventricular size is normal. No increase in right ventricular wall thickness. Global RV systolic function is has normal systolic function. Left Atrium: Left atrial size was normal in size. Right Atrium: Right atrial size was normal in size Pericardium: Trivial pericardial effusion is present. Mitral Valve: The mitral valve is normal in structure. No evidence of mitral valve regurgitation. Tricuspid Valve: The tricuspid valve is grossly normal. Tricuspid valve regurgitation is not demonstrated. Aortic Valve: The aortic valve is normal in structure. Aortic valve regurgitation is not visualized. The aortic valve is structurally normal, with no evidence of sclerosis or stenosis. Pulmonic Valve: The pulmonic valve was not well visualized. Pulmonic valve regurgitation is not visualized. Pulmonic regurgitation is not visualized. Aorta: The aortic root and ascending aorta are structurally normal, with no evidence of dilitation. Venous: The inferior vena cava is normal in size with greater than 50% respiratory variability, suggesting right atrial pressure of 3 mmHg. IAS/Shunts: The interatrial septum was not well  visualized.  LEFT VENTRICLE PLAX 2D LVIDd:         2.64 cm  Diastology LVIDs:         2.08 cm  LV e' lateral:   10.30 cm/s LV PW:         0.84 cm  LV E/e' lateral: 6.1 LV IVS:        1.15 cm  LV e' medial:    6.74 cm/s LVOT diam:     2.10 cm  LV E/e' medial:  9.3 LV SV:         11 ml LV SV Index:   5.46 LVOT Area:     3.46 cm  RIGHT VENTRICLE RV S prime:     17.70 cm/s TAPSE (M-mode): 1.7 cm LEFT ATRIUM           Index       RIGHT ATRIUM          Index LA diam:      2.60 cm 1.31 cm/m  RA Area:     9.55 cm LA Vol (A2C): 19.8 ml 9.95 ml/m  RA Volume:   16.90 ml 8.50 ml/m LA Vol  (A4C): 20.4 ml 10.26 ml/m  AORTIC VALVE LVOT Vmax:   80.30 cm/s LVOT Vmean:  58.100 cm/s LVOT VTI:    0.138 m  AORTA Ao Root diam: 2.90 cm MITRAL VALVE MV Area (PHT): 3.30 cm             SHUNTS MV PHT:        66.70 msec           Systemic VTI:  0.14 m MV Decel Time: 230 msec             Systemic Diam: 2.10 cm MV E velocity: 63.00 cm/s 103 cm/s MV A velocity: 73.30 cm/s 70.3 cm/s MV E/A ratio:  0.86       1.5  Oswaldo Milian MD Electronically signed by Oswaldo Milian MD Signature Date/Time: 11/11/2019/7:08:45 PM    Final    CT HEAD CODE STROKE WO CONTRAST  Result Date: 11/11/2019 CLINICAL DATA:  Code stroke.  Left-sided weakness EXAM: CT HEAD WITHOUT CONTRAST TECHNIQUE: Contiguous axial images were obtained from the base of the skull through the vertex without intravenous contrast. COMPARISON:  None. FINDINGS: Brain: No evidence of acute infarction, hemorrhage, hydrocephalus, extra-axial collection or mass lesion/mass effect. Mild white matter disease. Vascular: No hyperdense vessel or unexpected calcification. Skull: Normal. Negative for fracture or focal lesion. Sinuses/Orbits: No acute finding.  Bilateral cataract resection Other: These results were communicated to Dr. Rory Percy at 4:38 amon 1/15/2021by text page via the [facility] messaging system. ASPECTS [facility] Stroke Program Early CT Score) - Ganglionic level infarction (caudate, lentiform nuclei, internal capsule, insula, M1-M3 cortex): 7 - Supraganglionic infarction (M4-M6 cortex): 3 Total score (0-10 with 10 being normal): 10 IMPRESSION: 1. No acute finding. 2. ASPECTS is 10. Electronically Signed   By: Monte Fantasia M.D.   On: 11/11/2019 04:39   VAS [place] LOWER EXTREMITY VENOUS (DVT)  Result Date: 11/11/2019  Lower Venous Study Indications: Stroke.  Comparison Study: no prior Performing Technologist: Abram Sander RVS  Examination Guidelines: A complete evaluation includes B-mode imaging, spectral Doppler, color Doppler, and power Doppler as  needed of all accessible portions of each vessel. Bilateral testing is considered an integral part of a complete examination. Limited examinations for reoccurring indications may be performed as noted.  +---------+---------------+---------+-----------+----------+--------------+ RIGHT    CompressibilityPhasicitySpontaneityPropertiesThrombus Aging +---------+---------------+---------+-----------+----------+--------------+ CFV  Full           Yes      Yes                                 +---------+---------------+---------+-----------+----------+--------------+ SFJ      Full                                                        +---------+---------------+---------+-----------+----------+--------------+ FV Prox  Full                                                        +---------+---------------+---------+-----------+----------+--------------+ FV Mid   Full                                                        +---------+---------------+---------+-----------+----------+--------------+ FV DistalFull                                                        +---------+---------------+---------+-----------+----------+--------------+ PFV      Full                                                        +---------+---------------+---------+-----------+----------+--------------+ POP      Full           Yes      Yes                                 +---------+---------------+---------+-----------+----------+--------------+ PTV      Full                                                        +---------+---------------+---------+-----------+----------+--------------+ PERO     Full                                                        +---------+---------------+---------+-----------+----------+--------------+   +---------+---------------+---------+-----------+----------+--------------+ LEFT     CompressibilityPhasicitySpontaneityPropertiesThrombus  Aging +---------+---------------+---------+-----------+----------+--------------+ CFV      Full           Yes      Yes                                 +---------+---------------+---------+-----------+----------+--------------+  SFJ      Full                                                        +---------+---------------+---------+-----------+----------+--------------+ FV Prox  Full                                                        +---------+---------------+---------+-----------+----------+--------------+ FV Mid   Full                                                        +---------+---------------+---------+-----------+----------+--------------+ FV DistalFull                                                        +---------+---------------+---------+-----------+----------+--------------+ PFV      Full                                                        +---------+---------------+---------+-----------+----------+--------------+ POP      Full           Yes      Yes                                 +---------+---------------+---------+-----------+----------+--------------+ PTV      Full                                                        +---------+---------------+---------+-----------+----------+--------------+ PERO     Full                                                        +---------+---------------+---------+-----------+----------+--------------+     Summary: Right: There is no evidence of deep vein thrombosis in the lower extremity. No cystic structure found in the popliteal fossa. Left: There is no evidence of deep vein thrombosis in the lower extremity. No cystic structure found in the popliteal fossa.  *See table(s) above for measurements and observations. Electronically signed by Sherald Hesshristopher Clark MD on 11/11/2019 at 4:03:22 PM.    Final     Labs: BNP (last 3 results) No results for input(s): BNP in the last 8760 hours. Basic  Metabolic Panel: Recent Labs  Lab 11/11/19 0437 11/11/19 0443  NA 141 139  K 4.0 4.0  CL 105 103  CO2  --  24  GLUCOSE 145* 142*  BUN 13 12  CREATININE 0.70 0.85  CALCIUM  --  9.6   Liver Function Tests: Recent Labs  Lab 11/11/19 0443  AST 19  ALT 20  ALKPHOS 97  BILITOT 0.2*  PROT 6.5  ALBUMIN 4.2   No results for input(s): LIPASE, AMYLASE in the last 168 hours. No results for input(s): AMMONIA in the last 168 hours. CBC: Recent Labs  Lab 11/11/19 0437 11/11/19 0443  WBC  --  9.5  NEUTROABS  --  7.1  HGB 15.6* 15.7*  HCT 46.0 47.2*  MCV  --  91.1  PLT  --  280   Cardiac Enzymes: No results for input(s): CKTOTAL, CKMB, CKMBINDEX, TROPONINI in the last 168 hours. BNP: Invalid input(s): POCBNP CBG: No results for input(s): GLUCAP in the last 168 hours. D-Dimer No results for input(s): DDIMER in the last 72 hours. Hgb A1c Recent Labs    11/11/19 1500  HGBA1C 5.4   Lipid Profile Recent Labs    11/12/19 0231  CHOL 229*  HDL 45  LDLCALC 159*  TRIG 127  CHOLHDL 5.1   Thyroid function studies No results for input(s): TSH, T4TOTAL, T3FREE, THYROIDAB in the last 72 hours.  Invalid input(s): FREET3 Anemia work up No results for input(s): VITAMINB12, FOLATE, FERRITIN, TIBC, IRON, RETICCTPCT in the last 72 hours. Urinalysis    Component Value Date/Time   COLORURINE STRAW (A) 11/11/2019 0338   APPEARANCEUR CLEAR 11/11/2019 0338   LABSPEC 1.003 (L) 11/11/2019 0338   PHURINE 6.0 11/11/2019 0338   GLUCOSEU NEGATIVE 11/11/2019 0338   HGBUR NEGATIVE 11/11/2019 0338   BILIRUBINUR NEGATIVE 11/11/2019 0338   KETONESUR NEGATIVE 11/11/2019 0338   PROTEINUR NEGATIVE 11/11/2019 0338   NITRITE NEGATIVE 11/11/2019 0338   LEUKOCYTESUR TRACE (A) 11/11/2019 0338   Sepsis Labs Invalid input(s): PROCALCITONIN,  WBC,  LACTICIDVEN Microbiology Recent Results (from the past 240 hour(s))  SARS CORONAVIRUS 2 (TAT 6-24 HRS) Nasopharyngeal Nasopharyngeal Swab      Status: None   Collection Time: 11/11/19  7:05 AM   Specimen: Nasopharyngeal Swab  Result Value Ref Range Status   SARS Coronavirus 2 NEGATIVE NEGATIVE Final    Comment: (NOTE) SARS-CoV-2 target nucleic acids are NOT DETECTED. The SARS-CoV-2 RNA is generally detectable in upper and lower respiratory specimens during the acute phase of infection. Negative results do not preclude SARS-CoV-2 infection, do not rule out co-infections with other pathogens, and should not be used as the sole basis for treatment or other patient management decisions. Negative results must be combined with clinical observations, patient history, and epidemiological information. The expected result is Negative. Fact Sheet for Patients: HairSlick.nohttps://www.fda.gov/media/138098/download Fact Sheet for Healthcare Providers: quierodirigir.comhttps://www.fda.gov/media/138095/download This test is not yet approved or cleared by the [place] FDA and  has been authorized for detection and/or diagnosis of SARS-CoV-2 by FDA under an Emergency Use Authorization (EUA). This EUA will remain  in effect (meaning this test can be used) for the duration of the COVID-19 declaration under Section 56 4(b)(1) of the Act, 21 U.S.C. section 360bbb-3(b)(1), unless the authorization is terminated or revoked sooner. Performed at [facility], [address] [place], [place] [address]      Time coordinating discharge: 25  minutes  SIGNED: Lanae Boastamesh Aliana Kreischer, MD  [facility] 11/12/2019, 1:20 PM  If 7PM-7AM, please contact night-coverage [email or web address]

## 2019-11-12 NOTE — Progress Notes (Signed)
Pt discharged via wheelchair with NT 

## 2019-11-12 NOTE — Progress Notes (Signed)
Case management addressed pt discharge needs NT removed pt IV, catheter intact, and removed telemetry Pt discharge education provided at bedside  Pt has all belongings including stroke: mapping book and out of work letter  Pt awaiting discharge transportation

## 2019-11-12 NOTE — Care Management (Signed)
Referral made to [facility] OP therapy center, per patient preference. She states she would rather use her husband's RW than wait 3 hours for one to be delivered to her room.

## 2019-11-12 NOTE — Evaluation (Signed)
Occupational Therapy Evaluation Patient Details Name: Nancy Travis MRN: [id number] DOB: 06/14/1951 Today's Date: 11/12/2019    History of Present Illness Nancy Travis is a 69 y.o. female with no past medical history, last seen normal at 11:30 PM on 11/10/2019, brought in for evaluation of left-sided heaviness. MRI showing cluster of small acute infarcts in a right MCA branch   Clinical Impression   Pt PTA: pt living independently and working. Pt currently performing ADL and mobility with supervisionA for stability. Pt [place] advised for standing for ADL; pt performing own ADL in bathroom after set-upA in sitting.  Pt requires no physical assist to complete task. Pt mobilizing well with no AD and slight limp on L side. Pt would greatly benefit from continued OT skilled services for OP OT for neuro based LUE coordination and strengthening deficits. Pt able to use LUE with minimal weakness and minimal coordination deficits. Pt reporting "I can move a lot better than I was yesterday." OT signing off.      Follow Up Recommendations  Outpatient OT;Supervision/Assistance - 24 hour(initially for fine motor coordination/strength on LUE; neuro)    Equipment Recommendations  None recommended by OT    Recommendations for Other Services       Precautions / Restrictions Precautions Precautions: Fall Restrictions Weight Bearing Restrictions: No      Mobility Bed Mobility Overal bed mobility: Modified Independent                Transfers Overall transfer level: Needs assistance Equipment used: None Transfers: Sit to/from Stand Sit to Stand: Supervision              Balance Overall balance assessment: Needs assistance Sitting-balance support: Feet supported Sitting balance-Leahy Scale: Normal     Standing balance support: No upper extremity supported;During functional activity Standing balance-Leahy Scale: Good                              ADL either performed or assessed with clinical judgement   ADL Overall ADL's : At baseline                                       General ADL Comments: Supervision advised for standing for ADL; pt performing own ADL in bathroom after set-upA in sitting.  Pt requires no physical assist to complete task.     Vision Baseline Vision/History: Wears glasses Wears Glasses: At all times Patient Visual Report: No change from baseline Vision Assessment?: Yes Eye Alignment: Within Functional Limits Ocular Range of Motion: Within Functional Limits Alignment/Gaze Preference: Within Defined Limits     Perception Perception Perception Tested?: No   Praxis      Pertinent Vitals/Pain Pain Assessment: Faces Faces Pain Scale: No hurt     Hand Dominance Right   Extremity/Trunk Assessment Upper Extremity Assessment Upper Extremity Assessment: LUE deficits/detail RUE Deficits / Details: Strength 5/5 LUE Deficits / Details: AROM is WFLs; 4 to 4+/5 MM grade and decreased grip strength/coordination LUE Coordination: decreased fine motor   Lower Extremity Assessment Lower Extremity Assessment: Defer to PT evaluation;LLE deficits/detail LLE Deficits / Details: slight limp with mobility   Cervical / Trunk Assessment Cervical / Trunk Assessment: Normal   Communication Communication Communication: No difficulties   Cognition Arousal/Alertness: Awake/alert Behavior During Therapy: WFL for tasks assessed/performed Overall Cognitive Status: Within Functional Limits for tasks assessed  General Comments: Pt aware of deficits.   General Comments       Exercises Exercises: Other exercises Other Exercises Other Exercises: fine motor coordination   Shoulder Instructions      Home Living Family/patient expects to be discharged to:: Private residence Living Arrangements: Spouse/significant other Available Help at Discharge:  Family Type of Home: Apartment Home Access: Elevator     Home Layout: One level     Bathroom Shower/Tub: Tub/shower unit         Home Equipment: Environmental consultant - 2 wheels;Cane - single point          Prior Functioning/Environment Level of Independence: Independent                 OT Problem List: Decreased strength;Decreased coordination      OT Treatment/Interventions:      OT Goals(Current goals can be found in the care plan section) Acute Rehab OT Goals Patient Stated Goal: "get back to normal." OT Goal Formulation: With patient Time For Goal Achievement: 11/26/19  OT Frequency:     Barriers to D/C:            Co-evaluation              AM-PAC OT "6 Clicks" Daily Activity     Outcome Measure Help from another person eating meals?: None Help from another person taking care of personal grooming?: None Help from another person toileting, which includes using toliet, bedpan, or urinal?: None Help from another person bathing (including washing, rinsing, drying)?: A Little Help from another person to put on and taking off regular upper body clothing?: None Help from another person to put on and taking off regular lower body clothing?: None 6 Click Score: 23   End of Session Nurse Communication: Mobility status  Activity Tolerance: Patient tolerated treatment well Patient left: in chair;with call bell/phone within reach;with chair alarm set  OT Visit Diagnosis: Muscle weakness (generalized) (M62.81)                Time: 1443-1540 OT Time Calculation (min): 30 min Charges:  OT General Charges $OT Visit: 1 Visit OT Evaluation $OT Eval Moderate Complexity: 1 Mod OT Treatments $Neuromuscular Re-education: 8-22 mins  Flora Lipps OTR/L Acute Rehabilitation Services Pager: [phone] Office: [phone]   [name] C 11/12/2019, 10:35 AM

## 2019-11-12 NOTE — Evaluation (Signed)
Speech Language Pathology Evaluation Patient Details Name: Nancy Travis MRN: [id number] DOB: October 09, 1951 Today's Date: 11/12/2019 Time: 8786-7672 SLP Time Calculation (min) (ACUTE ONLY): 8 min  Problem List:  Patient Active Problem List   Diagnosis Date Noted  . Acute CVA (cerebrovascular accident) (HCC) 11/11/2019  . Obesity with body mass index of 30.0-39.9 11/11/2019  . Hyperglycemia 11/11/2019   Past Medical History:  Past Medical History:  Diagnosis Date  . CVA (cerebral vascular accident) (HCC) 11/11/2019  . Lumbar herniated disc   . Obesity (BMI 35.0-39.9 without comorbidity)    Past Surgical History:  Past Surgical History:  Procedure Laterality Date  . DILATION AND CURETTAGE OF UTERUS    . KNEE SURGERY    . TUBAL LIGATION     HPI:  Nancy Travis is a 69 y.o. female with no past medical history, last seen normal at 11:30 PM on 11/10/2019, brought in for evaluation of left-sided heaviness. MRI showing cluster of small acute infarcts in a right MCA branch   Assessment / Plan / Recommendation Clinical Impression  Patient presents with normal cognitive linguistic function. No acute SLP needs past evaluation needed at this time.     SLP Assessment  SLP Recommendation/Assessment: Patient does not need any further Speech Lanaguage Pathology Services SLP Visit Diagnosis: Cognitive communication deficit (R41.841)    Follow Up Recommendations  None          SLP Evaluation Cognition  Overall Cognitive Status: Within Functional Limits for tasks assessed Orientation Level: Oriented X4       Comprehension  Auditory Comprehension Overall Auditory Comprehension: Appears within functional limits for tasks assessed Visual Recognition/Discrimination Discrimination: Within Function Limits Reading Comprehension Reading Status: Within funtional limits    Expression Expression Primary Mode of Expression: Verbal Verbal Expression Overall Verbal Expression:  Appears within functional limits for tasks assessed Written Expression Dominant Hand: Right   Oral / Motor  Oral Motor/Sensory Function Overall Oral Motor/Sensory Function: Within functional limits Motor Speech Overall Motor Speech: Appears within functional limits for tasks assessed   GO            Nancy Lango MA, CCC-SLP          Nancy Travis 11/12/2019, 10:38 AM

## 2019-11-17 ENCOUNTER — Other Ambulatory Visit: Payer: Self-pay | Admitting: *Deleted

## 2019-11-17 ENCOUNTER — Telehealth: Payer: Self-pay | Admitting: *Deleted

## 2019-11-17 DIAGNOSIS — I4891 Unspecified atrial fibrillation: Secondary | ICD-10-CM

## 2019-11-17 DIAGNOSIS — I639 Cerebral infarction, unspecified: Secondary | ICD-10-CM

## 2019-11-17 NOTE — Telephone Encounter (Signed)
Patient enrolled for [employer] to ship a 30 day cardiac event monitor to her home. 

## 2019-11-30 ENCOUNTER — Ambulatory Visit (INDEPENDENT_AMBULATORY_CARE_PROVIDER_SITE_OTHER): Payer: BC Managed Care – PPO

## 2019-11-30 DIAGNOSIS — I639 Cerebral infarction, unspecified: Secondary | ICD-10-CM

## 2019-11-30 DIAGNOSIS — I4891 Unspecified atrial fibrillation: Secondary | ICD-10-CM

## 2020-01-02 ENCOUNTER — Other Ambulatory Visit: Payer: Self-pay

## 2020-01-02 ENCOUNTER — Ambulatory Visit (INDEPENDENT_AMBULATORY_CARE_PROVIDER_SITE_OTHER): Payer: BC Managed Care – PPO | Admitting: Neurology

## 2020-01-02 ENCOUNTER — Encounter: Payer: Self-pay | Admitting: Neurology

## 2020-01-02 VITALS — BP 143/72 | HR 72 | Temp 97.6°F | Ht 64.0 in | Wt 215.0 lb

## 2020-01-02 DIAGNOSIS — I639 Cerebral infarction, unspecified: Secondary | ICD-10-CM | POA: Diagnosis not present

## 2020-01-02 DIAGNOSIS — E7849 Other hyperlipidemia: Secondary | ICD-10-CM | POA: Diagnosis not present

## 2020-01-02 NOTE — Patient Instructions (Signed)
I had a long d/w patient about her recent cryptogenicstroke, risk for recurrent stroke/TIAs, personally independently reviewed imaging studies and stroke evaluation results and answered questions.Continue aspirin 81 mg daily  for secondary stroke prevention and maintain strict control of hypertension with blood pressure goal below 130/90, diabetes with hemoglobin A1c goal below 6.5% and lipids with LDL cholesterol goal below 70 mg/dL. I also advised the patient to eat a healthy diet with plenty of whole grains, cereals, fruits and vegetables, exercise regularly and maintain ideal body weight.  I recommend check lipid profile and hepatic function labs today.  Patient is now willing for outpatient loop recorder insertion and will I will refer the patient to Dr. Rayann Heman for the same.  She will also consider possible participation in the [place] trial for stroke prevention and will give her information to review and decide followup in the future with my nurse practitioner Janett Billow in 3 months or call earlier if necessary.  Stroke Prevention Some medical conditions and behaviors are associated with a higher chance of having a stroke. You can help prevent a stroke by making nutrition, lifestyle, and other changes, including managing any medical conditions you may have. What nutrition changes can be made?   Eat healthy foods. You can do this by: ? Choosing foods high in fiber, such as fresh fruits and vegetables and whole grains. ? Eating at least 5 or more servings of fruits and vegetables a day. Try to fill half of your plate at each meal with fruits and vegetables. ? Choosing lean protein foods, such as lean cuts of meat, poultry without skin, fish, tofu, beans, and nuts. ? Eating low-fat dairy products. ? Avoiding foods that are high in salt (sodium). This can help lower blood pressure. ? Avoiding foods that have saturated fat, trans fat, and cholesterol. This can help prevent high cholesterol. ? Avoiding  processed and premade foods.  Follow your health care provider's specific guidelines for losing weight, controlling high blood pressure (hypertension), lowering high cholesterol, and managing diabetes. These may include: ? Reducing your daily calorie intake. ? Limiting your daily sodium intake to 1,500 milligrams (mg). ? Using only healthy fats for cooking, such as olive oil, canola oil, or sunflower oil. ? Counting your daily carbohydrate intake. What lifestyle changes can be made?  Maintain a healthy weight. Talk to your health care provider about your ideal weight.  Get at least 30 minutes of moderate physical activity at least 5 days a week. Moderate activity includes brisk walking, biking, and swimming.  Do not use any products that contain nicotine or tobacco, such as cigarettes and e-cigarettes. If you need help quitting, ask your health care provider. It may also be helpful to avoid exposure to secondhand smoke.  Limit alcohol intake to no more than 1 drink a day for nonpregnant women and 2 drinks a day for men. One drink equals 12 oz of beer, 5 oz of wine, or 1 oz of hard liquor.  Stop any illegal drug use.  Avoid taking birth control pills. Talk to your health care provider about the risks of taking birth control pills if: ? You are over 62 years old. ? You smoke. ? You get migraines. ? You have ever had a blood clot. What other changes can be made?  Manage your cholesterol levels. ? Eating a healthy diet is important for preventing high cholesterol. If cholesterol cannot be managed through diet alone, you may also need to take medicines. ? Take any prescribed medicines to  control your cholesterol as told by your health care provider.  Manage your diabetes. ? Eating a healthy diet and exercising regularly are important parts of managing your blood sugar. If your blood sugar cannot be managed through diet and exercise, you may need to take medicines. ? Take any prescribed  medicines to control your diabetes as told by your health care provider.  Control your hypertension. ? To reduce your risk of stroke, try to keep your blood pressure below 130/80. ? Eating a healthy diet and exercising regularly are an important part of controlling your blood pressure. If your blood pressure cannot be managed through diet and exercise, you may need to take medicines. ? Take any prescribed medicines to control hypertension as told by your health care provider. ? Ask your health care provider if you should monitor your blood pressure at home. ? Have your blood pressure checked every year, even if your blood pressure is normal. Blood pressure increases with age and some medical conditions.  Get evaluated for sleep disorders (sleep apnea). Talk to your health care provider about getting a sleep evaluation if you snore a lot or have excessive sleepiness.  Take over-the-counter and prescription medicines only as told by your health care provider. Aspirin or blood thinners (antiplatelets or anticoagulants) may be recommended to reduce your risk of forming blood clots that can lead to stroke.  Make sure that any other medical conditions you have, such as atrial fibrillation or atherosclerosis, are managed. What are the warning signs of a stroke? The warning signs of a stroke can be easily remembered as BEFAST.  B is for balance. Signs include: ? Dizziness. ? Loss of balance or coordination. ? Sudden trouble walking.  E is for eyes. Signs include: ? A sudden change in vision. ? Trouble seeing.  F is for face. Signs include: ? Sudden weakness or numbness of the face. ? The face or eyelid drooping to one side.  A is for arms. Signs include: ? Sudden weakness or numbness of the arm, usually on one side of the body.  S is for speech. Signs include: ? Trouble speaking (aphasia). ? Trouble understanding.  T is for time. ? These symptoms may represent a serious problem that is  an emergency. Do not wait to see if the symptoms will go away. Get medical help right away. Call your local emergency services (911 in the [place] Do not drive yourself to the hospital.  Other signs of stroke may include: ? A sudden, severe headache with no known cause. ? Nausea or vomiting. ? Seizure. Where to find more information For more information, visit:  American Stroke Association: www.strokeassociation.org  National Stroke Association: www.stroke.org Summary  You can prevent a stroke by eating healthy, exercising, not smoking, limiting alcohol intake, and managing any medical conditions you may have.  Do not use any products that contain nicotine or tobacco, such as cigarettes and e-cigarettes. If you need help quitting, ask your health care provider. It may also be helpful to avoid exposure to secondhand smoke.  Remember BEFAST for warning signs of stroke. Get help right away if you or a loved one has any of these signs. This information is not intended to replace advice given to you by your health care provider. Make sure you discuss any questions you have with your health care provider. Document Revised: 09/25/2017 Document Reviewed: 11/18/2016 Elsevier Patient Education  2020 [employer].

## 2020-01-02 NOTE — Progress Notes (Signed)
[facility] [address] [place]. [place] [address] [phone]       OFFICE FOLLOW-UP NOTE  Ms. NEYSA ARTS Date of Birth:  [birth year] Medical Record Number:  [id number]   HPI: Ms. Ramberg is a pleasant 69 year old Caucasian lady seen today for initial office follow-up visit following hospital admission for stroke in January 2021.  History is obtained from the patient and review of electronic medical records.  I personally reviewed imaging films in PACS.  She states she went to bed feeling normal on 11/10/2019.  She woke up in the morning at 230 and felt somewhat heavy on the left side and left arm and leg were not moving her commands.  She had difficulty walking.  She called her husband and asked him to drive to [facility].  On evaluation there NIH stroke scale was 0 and she had some subtle subjective left-sided weakness and heaviness.  Code stroke CT scan was normal with aspect score of 10.  MRI scan however showed small cluster of right MCA branch infarcts and the high frontal and parietal lobes.  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  CT angiogram of the brain and neck showed no large vessel proximal stenosis.  2D echo showed normal ejection fraction without cardiac source of embolism.  Lower extremity venous Dopplers were negative for DVT.  LDL cholesterol was elevated 159 mg percent.  Patient was started on Zocor 40 mg.  Hemoglobin A1c was 5.4.  Patient was started on dual antiplatelet therapy for 3 weeks and is currently on aspirin alone.  She states she is done well since discharge she still has some lack of dexterity in the left hand and finds some difficulty with typing but otherwise is doing fine.  She does have chronic back and neck pain which are persistent mid she has had no new neuro symptoms.  She did have some mild orthostatic dizziness for a few weeks after she first left the hospital but that seems to  have cleared now.  She has no new complaints.  She is tolerating aspirin well without bruising or bleeding.  She is also tolerating Zocor well without muscle aches and pain but has not yet had a follow-up lipid profile checked.  The patient did undergo 30-day external heart monitor which apparently did not show A. fib.  Patient appears to be considering loop recorder insertion now for paroxysmal A. fib. ROS:   14 system review of systems is positive for decreased dexterity in the hands, back pain, neck pain, gait difficulty and all other systems negative  PMH:  Past Medical History:  Diagnosis Date  . CVA (cerebral vascular accident) (HCC) 11/11/2019  . Lumbar herniated disc   . Obesity (BMI 35.0-39.9 without comorbidity)     Social History:  Social History   Socioeconomic History  . Marital status: Married    Spouse name: Not on file  . Number of children: Not on file  . Years of education: Not on file  . Highest education level: Not on file  Occupational History  . Occupation: Museum/gallery exhibitions officer  Tobacco Use  . Smoking status: Never Smoker  . Smokeless tobacco: Never Used  Substance and Sexual Activity  . Alcohol use: Never  . Drug use: Never  . Sexual activity: Not Currently  Other Topics Concern  . Not on file  Social History Narrative  . Not on file   Social Determinants of Health  Financial Resource Strain:   . Difficulty of Paying Living Expenses: Not on file  Food Insecurity:   . Worried About Charity fundraiser in the Last Year: Not on file  . Ran Out of Food in the Last Year: Not on file  Transportation Needs:   . Lack of Transportation (Medical): Not on file  . Lack of Transportation (Non-Medical): Not on file  Physical Activity:   . Days of Exercise per Week: Not on file  . Minutes of Exercise per Session: Not on file  Stress:   . Feeling of Stress : Not on file  Social Connections:   . Frequency of Communication with Friends and Family:  Not on file  . Frequency of Social Gatherings with Friends and Family: Not on file  . Attends Religious Services: Not on file  . Active Member of Clubs or Organizations: Not on file  . Attends Archivist Meetings: Not on file  . Marital Status: Not on file  Intimate Partner Violence:   . Fear of Current or Ex-Partner: Not on file  . Emotionally Abused: Not on file  . Physically Abused: Not on file  . Sexually Abused: Not on file    Medications:   Current Outpatient Medications on File Prior to Visit  Medication Sig Dispense Refill  . acetaminophen (TYLENOL) 500 MG tablet Take 500 mg by mouth every 6 (six) hours as needed.    Marland Kitchen aspirin EC 81 MG EC tablet Take 1 tablet (81 mg total) by mouth daily. 30 tablet 1  . naproxen sodium (ALEVE) 220 MG tablet Take 440 mg by mouth daily as needed (pain).    . simvastatin (ZOCOR) 40 MG tablet Take 1 tablet (40 mg total) by mouth daily at 6 PM. 30 tablet 1   No current facility-administered medications on file prior to visit.    Allergies:   Allergies  Allergen Reactions  . Penicillins Anaphylaxis and Hives    Did it involve swelling of the face/tongue/throat, SOB, or low BP? yes Did it involve sudden or severe rash/hives, skin peeling, or any reaction on the inside of your mouth or nose? Yes Did you need to seek medical attention at a hospital or doctor's office? yes When did it last happen?in her 89s If all above answers are "NO", may proceed with cephalosporin use.   . Levaquin [Levofloxacin] Nausea Only    Severe stomach pain  . Sulfa Antibiotics Nausea Only    Severe headache, dizzy spells  . Latex Rash    Physical Exam General: Mildly obese middle-aged Caucasian lady seated, in no evident distress Head: head normocephalic and atraumatic.  Neck: supple with no carotid or supraclavicular bruits Cardiovascular: regular rate and rhythm, no murmurs Musculoskeletal: no deformity Skin:  no rash/petichiae Vascular:   Normal pulses all extremities Vitals:   01/02/20 0912  BP: (!) 143/72  Pulse: 72  Temp: 97.6 F (36.4 C)   Neurologic Exam Mental Status: Awake and fully alert. Oriented to place and time. Recent and remote memory intact. Attention span, concentration and fund of knowledge appropriate. Mood and affect appropriate.  Cranial Nerves: Fundoscopic exam reveals sharp disc margins. Pupils equal, briskly reactive to light. Extraocular movements full without nystagmus. Visual fields full to confrontation. Hearing intact. Facial sensation intact. Face, tongue, palate moves normally and symmetrically.  Motor: Normal bulk and tone. Normal strength in all tested extremity muscles.  Diminished fine finger movements on the left.  Orbits right over left upper extremity. Sensory.: intact to  touch ,pinprick .position and vibratory sensation.  Coordination: Rapid alternating movements normal in all extremities. Finger-to-nose and heel-to-shin performed accurately bilaterally. Gait and Station: Arises from chair without difficulty. Stance is normal. Gait demonstrates normal stride length and balance . Able to heel, toe and tandem walk with moderate  difficulty.  Reflexes: 1+ and symmetric. Toes downgoing.   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  Vascular risk factors of hyperlipidemia, borderline hypertension and obesity and age only.     PLAN: I had a long d/w patient about her recent cryptogenicstroke, risk for recurrent stroke/TIAs, personally independently reviewed imaging studies and stroke evaluation results and answered questions.Continue aspirin 81 mg daily  for secondary stroke prevention and maintain strict control of hypertension with blood pressure goal below 130/90, diabetes with hemoglobin A1c goal below 6.5% and lipids with LDL cholesterol goal below 70 mg/dL. I also advised the patient  to eat a healthy diet with plenty of whole grains, cereals, fruits and vegetables, exercise regularly and maintain ideal body weight.  I recommend check lipid profile and hepatic function labs today.  Patient is now willing for outpatient loop recorder insertion and will I will refer the patient to Dr. Johney Frame for the same.  She will also consider possible participation in the [place] trial for stroke prevention and will give her information to review and decide followup in the future with my nurse practitioner Shanda Bumps in 3 months or call earlier if necessary. Greater than 50% of time during this 25 minute visit was spent on counseling,explanation of diagnosis of cryptogenic stroke, planning of further management, discussion with patient and family and coordination of care Delia Heady, MD Note: This document was prepared with digital dictation and possible smart phrase technology. Any transcriptional errors that result from this process are unintentional

## 2020-01-03 ENCOUNTER — Other Ambulatory Visit (HOSPITAL_COMMUNITY): Payer: Self-pay | Admitting: Neurology

## 2020-01-03 LAB — HEPATIC FUNCTION PANEL
ALT: 17 IU/L (ref 0–32)
AST: 19 IU/L (ref 0–40)
Albumin: 4.3 g/dL (ref 3.8–4.8)
Alkaline Phosphatase: 111 IU/L (ref 39–117)
Bilirubin Total: 0.3 mg/dL (ref 0.0–1.2)
Bilirubin, Direct: 0.09 mg/dL (ref 0.00–0.40)
Total Protein: 6.5 g/dL (ref 6.0–8.5)

## 2020-01-03 LAB — LIPID PANEL
Chol/HDL Ratio: 3.8 ratio (ref 0.0–4.4)
Cholesterol, Total: 212 mg/dL — ABNORMAL HIGH (ref 100–199)
HDL: 56 mg/dL (ref >39)
LDL Chol Calc (NIH): 131 mg/dL — ABNORMAL HIGH (ref 0–99)
Triglycerides: 139 mg/dL (ref 0–149)
VLDL Cholesterol Cal: 25 mg/dL (ref 5–40)

## 2020-01-03 MED ORDER — SIMVASTATIN 40 MG PO TABS: 60.0000 mg | ORAL_TABLET | Freq: Every day | ORAL | 1 refills | Status: AC

## 2020-01-03 NOTE — Progress Notes (Signed)
Kindly inform the patient that cholesterol profile is still not satisfactory and the bad cholesterol is not at goal hence I recommend she increase the dose of simvastatin to 60 mg daily

## 2020-01-04 ENCOUNTER — Telehealth: Payer: Self-pay

## 2020-01-04 NOTE — Telephone Encounter (Signed)
-----   Message from Micki Riley, MD sent at 01/03/2020  4:39 PM EST ----- Kindly inform the patient that cholesterol profile is still not satisfactory and the bad cholesterol is not at goal hence I recommend she increase the dose of simvastatin to 60 mg daily

## 2020-01-04 NOTE — Telephone Encounter (Signed)
called the patient that cholesterol profile is still not satisfactory and the bad cholesterol is not at goal hence I stated Dr.SEthi recommend she increase the dose of simvastatin to 60 mg daily. I advise pt the medication was sent to listed pharmacy. Pt verbalized understanding.

## 2020-02-01 ENCOUNTER — Telehealth: Payer: Self-pay | Admitting: Internal Medicine

## 2020-02-01 ENCOUNTER — Telehealth: Payer: Self-pay | Admitting: *Deleted

## 2020-02-01 NOTE — Telephone Encounter (Signed)
Re-routed to pre-cert pool.

## 2020-02-01 NOTE — Telephone Encounter (Signed)
I did not see anything about a loop recorder. Looks like the pt is enrolled in [employer] for a monitor. I am going to route this call to both device clinic and monitor tech.

## 2020-02-01 NOTE — Telephone Encounter (Signed)
LVM informing patient the heart monitoring test done recently was negative for irregular heart beats. Please continue current treatment of medications. Left # for questions.

## 2020-02-01 NOTE — Telephone Encounter (Signed)
Patient calling to find out if the loop recorder procedure has been approved by her insurance.

## 2020-02-01 NOTE — Telephone Encounter (Signed)
Received call back from patient asking about the cost of loop recorder; she stated she  left message at cardiology office this morning. She is to have it inserted on Fri, wants to know if it's approved first. I informed her that this is the neurology office, not cardiology.  I called her about the external heart monitor she wore. She verbalized understanding, appreciation.

## 2020-02-03 ENCOUNTER — Institutional Professional Consult (permissible substitution): Payer: BC Managed Care – PPO | Admitting: Internal Medicine

## 2020-04-23 ENCOUNTER — Ambulatory Visit: Payer: BC Managed Care – PPO | Admitting: Adult Health

## 2020-12-15 IMAGING — CT CT HEAD CODE STROKE
3 series · 15 of 47 positions shown, 18 images · non-contrast
Comparison: None.

CLINICAL DATA: Code stroke.  Left-sided weakness

EXAM:
CT HEAD WITHOUT CONTRAST
TECHNIQUE: Contiguous axial images were obtained from the base of the skull
through the vertex without intravenous contrast.

[Series 3: head 5.0 st · axial · 0.42mm/px · z∈[-77,+48]mm · 9 of 30 slices shown, 12 images]
[im 3/30  brain]
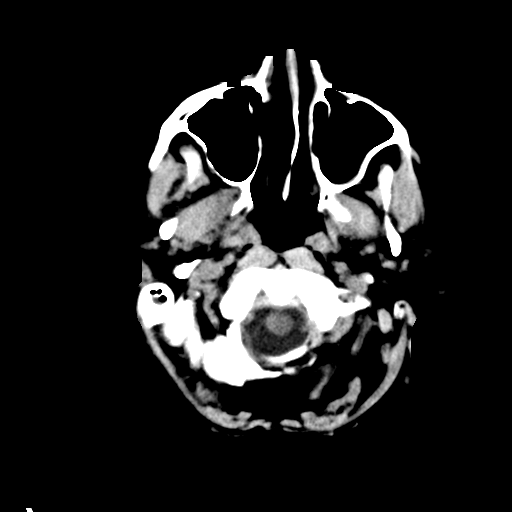
[im 3/30  bone]
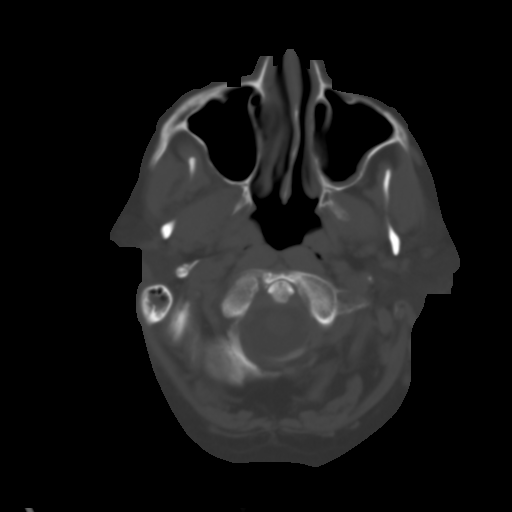
[im 6/30  brain]
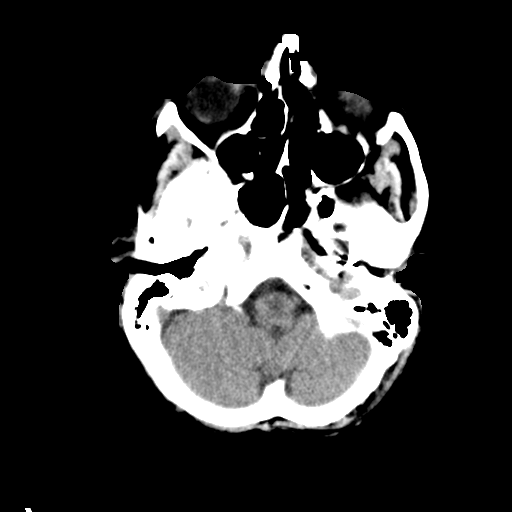
[im 9/30  brain]
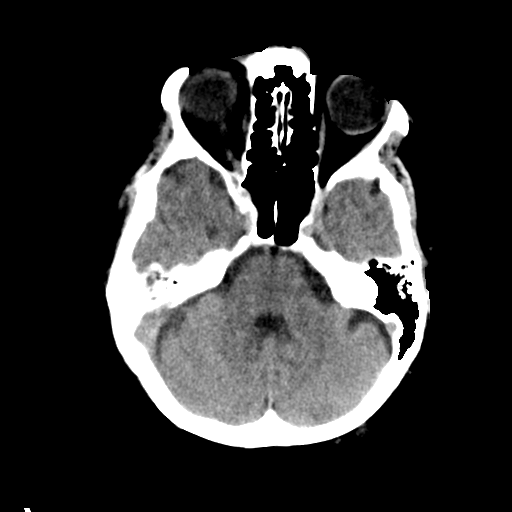
[im 12/30  brain]
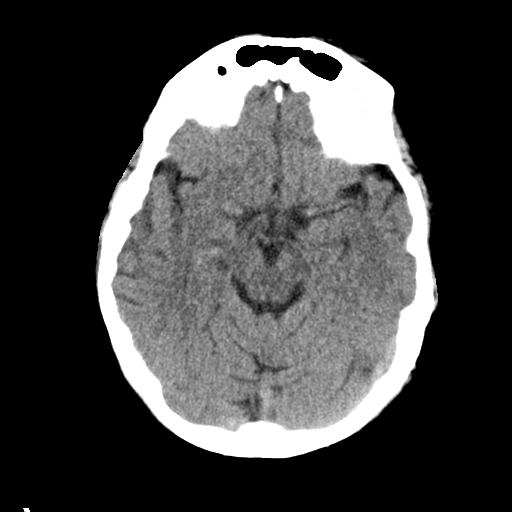
[im 16/30  brain]
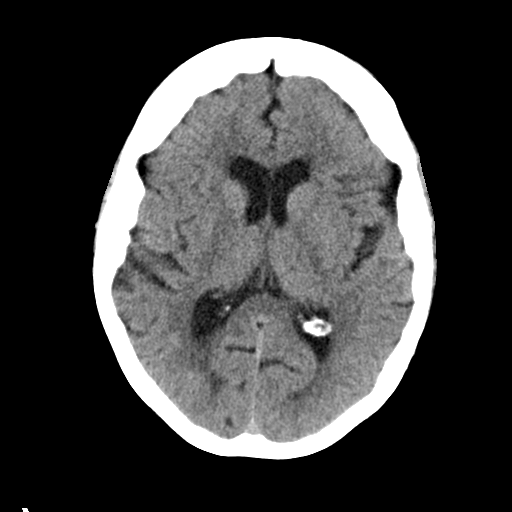
[im 16/30  bone]
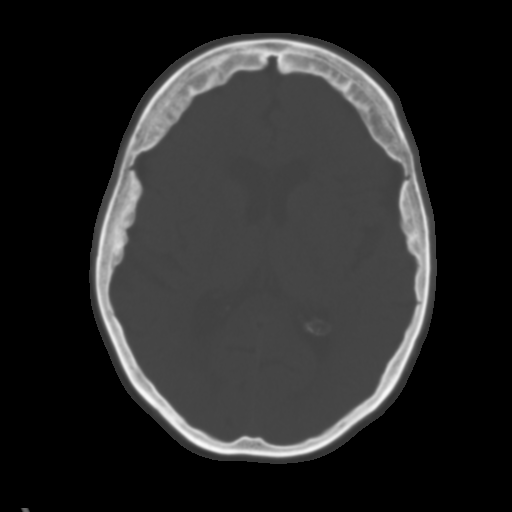
[im 19/30  brain]
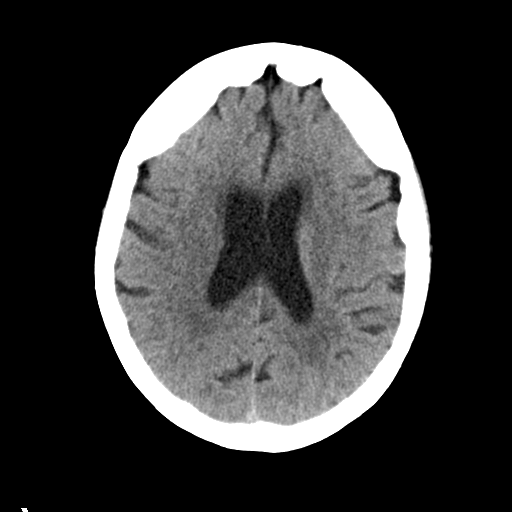
[im 22/30  brain]
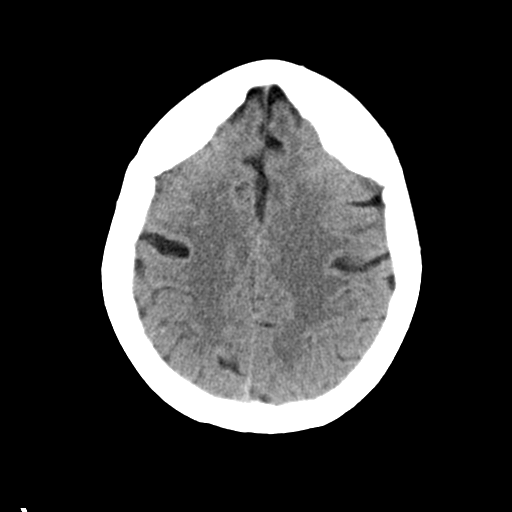
[im 25/30  brain]
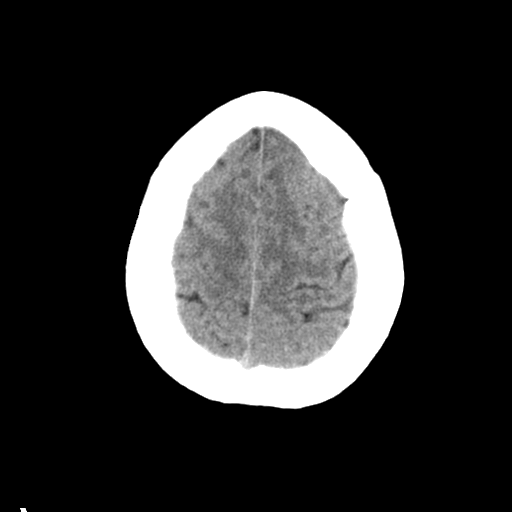
[im 28/30  brain]
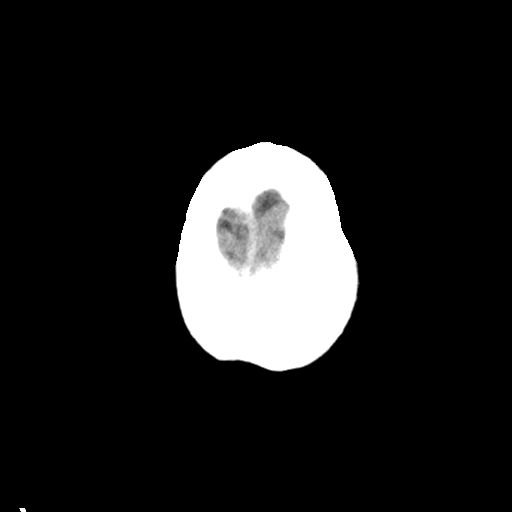
[im 28/30  bone]
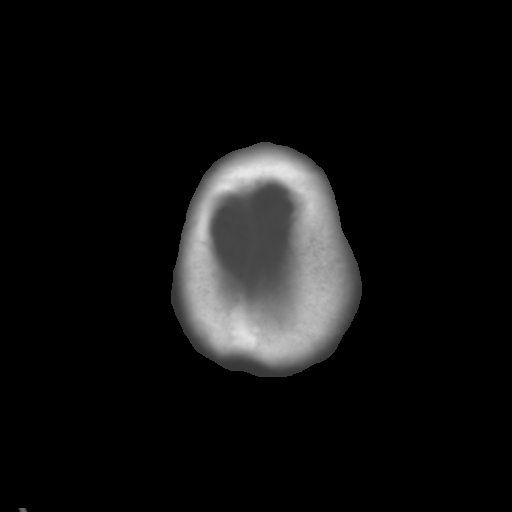

[Series 5: head 3.0 cor st · coronal · 0.29mm/px · 3 of 67 slices shown]
[im 23/67  brain]
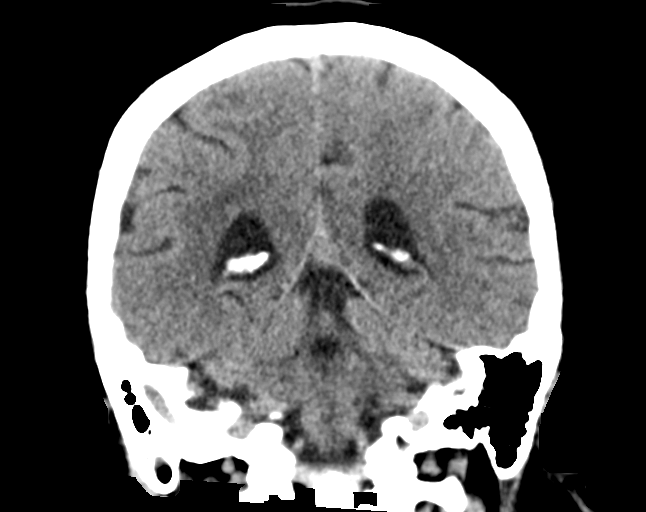
[im 30/67  brain]
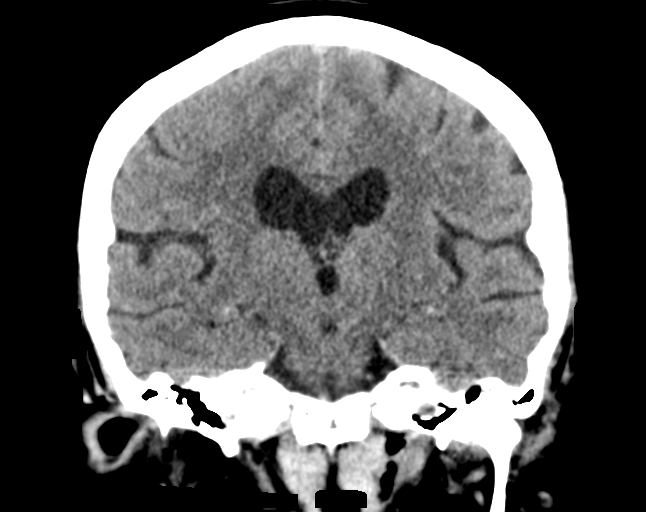
[im 37/67  brain]
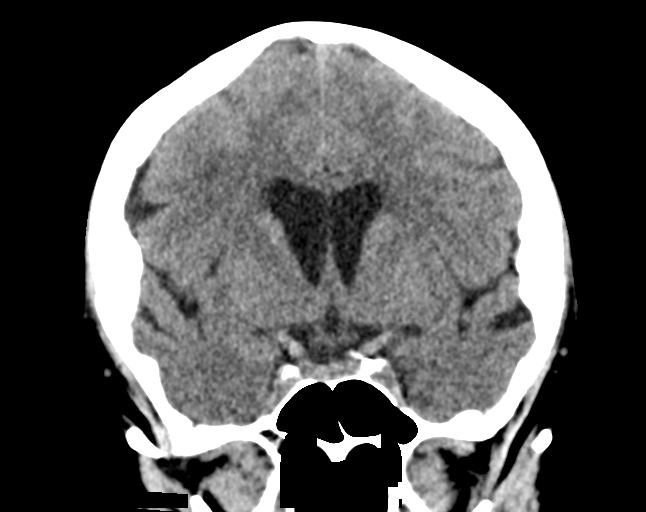

[Series 6: head 3.0 sag st · sagittal · 0.32mm/px · 3 of 58 slices shown]
[im 20/58  brain]
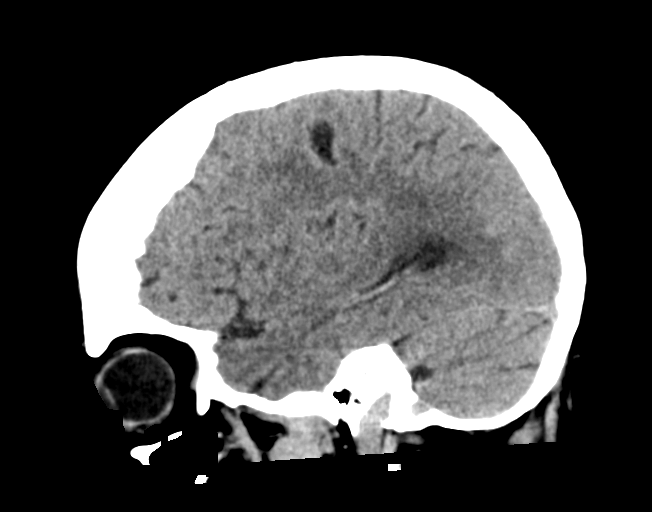
[im 29/58  brain]
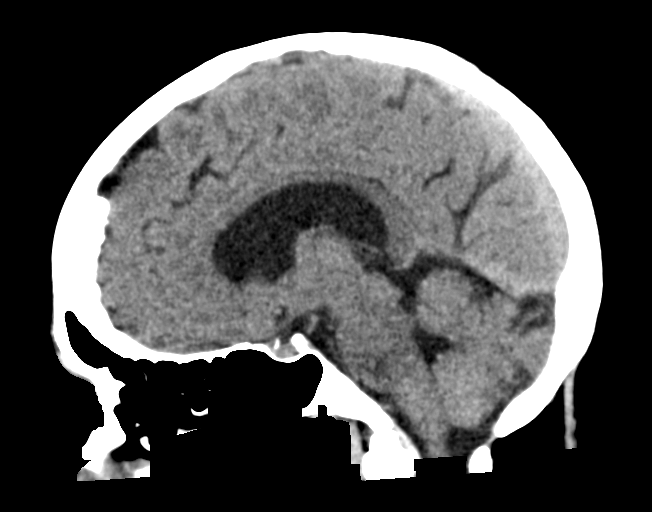
[im 39/58  brain]
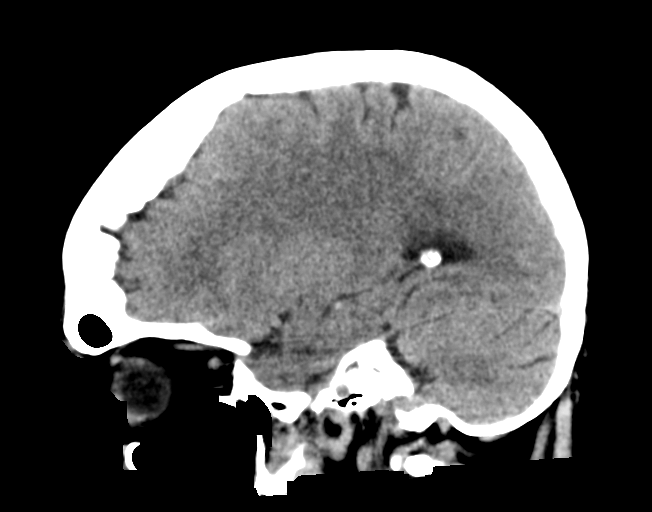

[15 of 47 positions shown; findings below may reference images not displayed]

FINDINGS: Brain: No evidence of acute infarction, hemorrhage, hydrocephalus,
extra-axial collection or mass lesion/mass effect. Mild white matter
disease.

Vascular: No hyperdense vessel or unexpected calcification.

Skull: Normal. Negative for fracture or focal lesion.

Sinuses/Orbits: No acute finding.  Bilateral cataract resection

Other: These results were communicated to Dr. Mak at [DATE] Sanjana
11/11/2019by text page via the [employer] messaging system.

ASPECTS (Alberta Stroke Program Early CT Score)

- Ganglionic level infarction (caudate, lentiform nuclei, internal
capsule, insula, M1-M3 cortex): 7

- Supraganglionic infarction (M4-M6 cortex): 3

Total score (0-10 with 10 being normal): 10
IMPRESSION: 1. No acute finding.
2. ASPECTS is 10.

## 2022-10-01 DIAGNOSIS — Z961 Presence of intraocular lens: Secondary | ICD-10-CM | POA: Diagnosis not present

## 2022-10-01 DIAGNOSIS — H5213 Myopia, bilateral: Secondary | ICD-10-CM | POA: Diagnosis not present

## 2022-10-01 DIAGNOSIS — H35342 Macular cyst, hole, or pseudohole, left eye: Secondary | ICD-10-CM | POA: Diagnosis not present

## 2022-10-01 DIAGNOSIS — H35372 Puckering of macula, left eye: Secondary | ICD-10-CM | POA: Diagnosis not present

## 2022-10-01 DIAGNOSIS — H40053 Ocular hypertension, bilateral: Secondary | ICD-10-CM | POA: Diagnosis not present

## 2022-10-01 DIAGNOSIS — H43813 Vitreous degeneration, bilateral: Secondary | ICD-10-CM | POA: Diagnosis not present

## 2022-10-01 DIAGNOSIS — H524 Presbyopia: Secondary | ICD-10-CM | POA: Diagnosis not present

## 2022-10-01 DIAGNOSIS — H52223 Regular astigmatism, bilateral: Secondary | ICD-10-CM | POA: Diagnosis not present

## 2022-10-08 DIAGNOSIS — Z0100 Encounter for examination of eyes and vision without abnormal findings: Secondary | ICD-10-CM | POA: Diagnosis not present

## 2022-11-06 DIAGNOSIS — R739 Hyperglycemia, unspecified: Secondary | ICD-10-CM | POA: Diagnosis not present

## 2022-11-06 DIAGNOSIS — E782 Mixed hyperlipidemia: Secondary | ICD-10-CM | POA: Diagnosis not present

## 2022-11-06 DIAGNOSIS — Z0000 Encounter for general adult medical examination without abnormal findings: Secondary | ICD-10-CM | POA: Diagnosis not present

## 2022-11-06 DIAGNOSIS — E538 Deficiency of other specified B group vitamins: Secondary | ICD-10-CM | POA: Diagnosis not present

## 2022-11-06 DIAGNOSIS — I6330 Cerebral infarction due to thrombosis of unspecified cerebral artery: Secondary | ICD-10-CM | POA: Diagnosis not present

## 2022-11-29 DIAGNOSIS — G4452 New daily persistent headache (NDPH): Secondary | ICD-10-CM | POA: Diagnosis not present

## 2022-11-29 DIAGNOSIS — Z961 Presence of intraocular lens: Secondary | ICD-10-CM | POA: Diagnosis not present

## 2022-11-29 DIAGNOSIS — J0100 Acute maxillary sinusitis, unspecified: Secondary | ICD-10-CM | POA: Diagnosis not present

## 2022-12-02 DIAGNOSIS — M5010 Cervical disc disorder with radiculopathy, unspecified cervical region: Secondary | ICD-10-CM | POA: Diagnosis not present

## 2022-12-02 DIAGNOSIS — Z23 Encounter for immunization: Secondary | ICD-10-CM | POA: Diagnosis not present

## 2022-12-02 DIAGNOSIS — E538 Deficiency of other specified B group vitamins: Secondary | ICD-10-CM | POA: Diagnosis not present

## 2022-12-02 DIAGNOSIS — R739 Hyperglycemia, unspecified: Secondary | ICD-10-CM | POA: Diagnosis not present

## 2023-06-23 DIAGNOSIS — E538 Deficiency of other specified B group vitamins: Secondary | ICD-10-CM | POA: Diagnosis not present

## 2023-06-23 DIAGNOSIS — R739 Hyperglycemia, unspecified: Secondary | ICD-10-CM | POA: Diagnosis not present

## 2023-06-23 DIAGNOSIS — E782 Mixed hyperlipidemia: Secondary | ICD-10-CM | POA: Diagnosis not present

## 2023-06-30 DIAGNOSIS — E538 Deficiency of other specified B group vitamins: Secondary | ICD-10-CM | POA: Diagnosis not present

## 2023-06-30 DIAGNOSIS — E1151 Type 2 diabetes mellitus with diabetic peripheral angiopathy without gangrene: Secondary | ICD-10-CM | POA: Diagnosis not present

## 2023-06-30 DIAGNOSIS — E782 Mixed hyperlipidemia: Secondary | ICD-10-CM | POA: Diagnosis not present

## 2023-11-05 DIAGNOSIS — E538 Deficiency of other specified B group vitamins: Secondary | ICD-10-CM | POA: Diagnosis not present

## 2023-11-05 DIAGNOSIS — E1151 Type 2 diabetes mellitus with diabetic peripheral angiopathy without gangrene: Secondary | ICD-10-CM | POA: Diagnosis not present

## 2023-11-12 DIAGNOSIS — Z0000 Encounter for general adult medical examination without abnormal findings: Secondary | ICD-10-CM | POA: Diagnosis not present

## 2023-11-12 DIAGNOSIS — E1151 Type 2 diabetes mellitus with diabetic peripheral angiopathy without gangrene: Secondary | ICD-10-CM | POA: Diagnosis not present

## 2023-11-12 DIAGNOSIS — E538 Deficiency of other specified B group vitamins: Secondary | ICD-10-CM | POA: Diagnosis not present

## 2024-06-01 DIAGNOSIS — E538 Deficiency of other specified B group vitamins: Secondary | ICD-10-CM | POA: Diagnosis not present

## 2024-06-01 DIAGNOSIS — E1151 Type 2 diabetes mellitus with diabetic peripheral angiopathy without gangrene: Secondary | ICD-10-CM | POA: Diagnosis not present

## 2024-06-14 DIAGNOSIS — E1151 Type 2 diabetes mellitus with diabetic peripheral angiopathy without gangrene: Secondary | ICD-10-CM | POA: Diagnosis not present

## 2024-06-14 DIAGNOSIS — Z79899 Other long term (current) drug therapy: Secondary | ICD-10-CM | POA: Diagnosis not present

## 2024-06-14 DIAGNOSIS — E538 Deficiency of other specified B group vitamins: Secondary | ICD-10-CM | POA: Diagnosis not present
